# Patient Record
Sex: Male | Born: 1995 | Race: Black or African American | Hispanic: No | Marital: Single | Smoking: Current some day smoker
Health system: Southern US, Community
[De-identification: ages and names within clinical notes are randomized; demographics above are authoritative.]

## PROBLEM LIST (undated history)

## (undated) DIAGNOSIS — N135 Crossing vessel and stricture of ureter without hydronephrosis: Secondary | ICD-10-CM

## (undated) DIAGNOSIS — J302 Other seasonal allergic rhinitis: Secondary | ICD-10-CM

## (undated) DIAGNOSIS — R112 Nausea with vomiting, unspecified: Secondary | ICD-10-CM

## (undated) DIAGNOSIS — J45909 Unspecified asthma, uncomplicated: Secondary | ICD-10-CM

## (undated) HISTORY — PX: FINGER SURGERY: SHX640

---
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
## 2015-06-21 ENCOUNTER — Encounter (HOSPITAL_COMMUNITY): Payer: Self-pay | Admitting: Emergency Medicine

## 2015-06-21 ENCOUNTER — Emergency Department (HOSPITAL_COMMUNITY): Payer: Medicaid Other

## 2015-06-21 ENCOUNTER — Emergency Department (HOSPITAL_COMMUNITY)
Admission: EM | Admit: 2015-06-21 | Discharge: 2015-06-21 | Disposition: A | Discharge status: Home / Self Care | Payer: Medicaid Other | Attending: Emergency Medicine | Admitting: Emergency Medicine

## 2015-06-21 DIAGNOSIS — I880 Nonspecific mesenteric lymphadenitis: Secondary | ICD-10-CM | POA: Diagnosis not present

## 2015-06-21 DIAGNOSIS — J45909 Unspecified asthma, uncomplicated: Secondary | ICD-10-CM | POA: Diagnosis not present

## 2015-06-21 DIAGNOSIS — Q6239 Other obstructive defects of renal pelvis and ureter: Secondary | ICD-10-CM | POA: Insufficient documentation

## 2015-06-21 DIAGNOSIS — Z720 Tobacco use: Secondary | ICD-10-CM | POA: Insufficient documentation

## 2015-06-21 DIAGNOSIS — R109 Unspecified abdominal pain: Secondary | ICD-10-CM | POA: Diagnosis present

## 2015-06-21 DIAGNOSIS — Q6211 Congenital occlusion of ureteropelvic junction: Secondary | ICD-10-CM

## 2015-06-21 LAB — URINALYSIS, ROUTINE W REFLEX MICROSCOPIC
Bilirubin Urine: NEGATIVE
Glucose, UA: NEGATIVE mg/dL
Hgb urine dipstick: NEGATIVE
Ketones, ur: NEGATIVE mg/dL
LEUKOCYTES UA: NEGATIVE
Nitrite: NEGATIVE
PROTEIN: NEGATIVE mg/dL
Specific Gravity, Urine: 1.016 (ref 1.005–1.030)
UROBILINOGEN UA: 0.2 mg/dL (ref 0.0–1.0)
pH: 5.5 (ref 5.0–8.0)

## 2015-06-21 LAB — CBC WITH DIFFERENTIAL/PLATELET
BASOS ABS: 0 10*3/uL (ref 0.0–0.1)
Basophils Relative: 0 %
Eosinophils Absolute: 0.4 10*3/uL (ref 0.0–0.7)
Eosinophils Relative: 4 %
HCT: 49.3 % (ref 39.0–52.0)
HEMOGLOBIN: 16.1 g/dL (ref 13.0–17.0)
LYMPHS PCT: 19 %
Lymphs Abs: 2.1 10*3/uL (ref 0.7–4.0)
MCH: 23.6 pg — ABNORMAL LOW (ref 26.0–34.0)
MCHC: 32.7 g/dL (ref 30.0–36.0)
MCV: 72.4 fL — ABNORMAL LOW (ref 78.0–100.0)
MONOS PCT: 7 %
Monocytes Absolute: 0.8 10*3/uL (ref 0.1–1.0)
NEUTROS ABS: 7.5 10*3/uL (ref 1.7–7.7)
NEUTROS PCT: 70 %
Platelets: 161 10*3/uL (ref 150–400)
RBC: 6.81 MIL/uL — AB (ref 4.22–5.81)
RDW: 13.7 % (ref 11.5–15.5)
WBC: 10.8 10*3/uL — AB (ref 4.0–10.5)

## 2015-06-21 LAB — COMPREHENSIVE METABOLIC PANEL
ALBUMIN: 4.7 g/dL (ref 3.5–5.0)
ALK PHOS: 88 U/L (ref 38–126)
ALT: 13 U/L — ABNORMAL LOW (ref 17–63)
ANION GAP: 9 (ref 5–15)
AST: 16 U/L (ref 15–41)
BUN: 20 mg/dL (ref 6–20)
CALCIUM: 9.7 mg/dL (ref 8.9–10.3)
CO2: 23 mmol/L (ref 22–32)
Chloride: 106 mmol/L (ref 101–111)
Creatinine, Ser: 1.42 mg/dL — ABNORMAL HIGH (ref 0.61–1.24)
GFR calc Af Amer: >60 mL/min (ref >60)
GFR calc non Af Amer: >60 mL/min (ref >60)
GLUCOSE: 96 mg/dL (ref 65–99)
Potassium: 3.9 mmol/L (ref 3.5–5.1)
SODIUM: 138 mmol/L (ref 135–145)
Total Bilirubin: 1.2 mg/dL (ref 0.3–1.2)
Total Protein: 8.4 g/dL — ABNORMAL HIGH (ref 6.5–8.1)

## 2015-06-21 LAB — LIPASE, BLOOD: Lipase: 26 U/L (ref 22–51)

## 2015-06-21 MED ORDER — IOHEXOL 300 MG/ML  SOLN
100.0000 mL | Freq: Once | INTRAMUSCULAR | Status: AC | PRN
  Administered 2015-06-21: 100 mL via INTRAVENOUS

## 2015-06-21 MED ORDER — OXYCODONE-ACETAMINOPHEN 5-325 MG PO TABS: 1.0000 | ORAL_TABLET | Freq: Four times a day (QID) | ORAL | Status: DC | PRN

## 2015-06-21 MED ORDER — ONDANSETRON HCL 4 MG PO TABS: 4.0000 mg | ORAL_TABLET | Freq: Four times a day (QID) | ORAL | Status: DC

## 2015-06-21 MED ORDER — IOHEXOL 300 MG/ML  SOLN
50.0000 mL | Freq: Once | INTRAMUSCULAR | Status: AC | PRN
  Administered 2015-06-21: 50 mL via ORAL

## 2015-06-21 MED ORDER — SODIUM CHLORIDE 0.9 % IV BOLUS (SEPSIS): 1000.0000 mL | Freq: Once | INTRAVENOUS | Status: DC

## 2015-06-21 MED ORDER — ONDANSETRON HCL 4 MG/2ML IJ SOLN
4.0000 mg | Freq: Once | INTRAMUSCULAR | Status: AC
  Administered 2015-06-21: 4 mg via INTRAVENOUS
  Filled 2015-06-21: qty 2

## 2015-06-21 MED ORDER — ONDANSETRON 4 MG PO TBDP
4.0000 mg | ORAL_TABLET | Freq: Once | ORAL | Status: AC
  Administered 2015-06-21: 4 mg via ORAL
  Filled 2015-06-21: qty 1

## 2015-06-21 MED ORDER — OXYCODONE-ACETAMINOPHEN 5-325 MG PO TABS
1.0000 | ORAL_TABLET | Freq: Once | ORAL | Status: AC
Start: 2015-06-21 — End: 2015-06-21
  Administered 2015-06-21: 1 via ORAL
  Filled 2015-06-21: qty 1

## 2015-06-21 MED ORDER — MORPHINE SULFATE (PF) 4 MG/ML IV SOLN
4.0000 mg | Freq: Once | INTRAVENOUS | Status: AC
  Administered 2015-06-21: 4 mg via INTRAVENOUS
  Filled 2015-06-21: qty 1

## 2015-06-21 NOTE — ED Provider Notes (Signed)
CSN: [id number]     Arrival date & time 06/21/15  0437 History   First MD Initiated Contact with Patient 06/21/15 0615     Chief Complaint  Patient presents with  . Flank Pain     (Consider location/radiation/quality/duration/timing/severity/associated sxs/prior Treatment) HPI   Austin Church 19 y.o.male  PCP: Nicolasa Ducking, MD  Blood pressure 139/81, pulse 80, temperature 98.2 F (36.8 C), temperature source Oral, resp. rate 18, height  (1.753 m), SpO2 100 %.  SIGNIFICANT PMH: seasonal allergies and asthma CHIEF COMPLAINT: Right Flank  When: Started approx 3 months Mechanism: no specific mechanism to cause pain Chronicity: > 3 months, never had this problem before then Location: Right flank Radiation: none Quality and severity: sometimes; 0/10 and tonight 7/10- cramping and knotting  Treatments tried: his doctor had given him a muscle relaxer and then a medication for bed time. Does not know names and neither helped. His PCP ordered a CT scan of the abdomen but he missed the appointment. He tried Vicodin given from his girlfriend and this helped some but then pain came back. -- Trazodone, Naproxen and Flexeril per pharmacy Alleviating factors: none Worsening factors: touching the area and laying down Associated Symptoms: vomiting  Negative ROS: Confusion, diaphoresis, fever, headache, weakness (general or focal), change of vision,  neck pain, dysphagia, aphagia, chest pain, shortness of breath,  back pain, diarrhea, lower extremity swelling, rash.   Past Medical History  Diagnosis Date  . Seasonal allergies   . Asthma    Past Surgical History  Procedure Laterality Date  . Finger surgery     Family History  Problem Relation Age of Onset  . Cancer Other    Social History  Substance Use Topics  . Smoking status: Current Every Day Smoker  . Smokeless tobacco: None  . Alcohol Use: Yes    Review of Systems  10 Systems reviewed and are negative for acute  change except as noted in the HPI.    Allergies  Pineapple  Home Medications   Prior to Admission medications   Medication Sig Start Date End Date Taking? Authorizing Provider  cyclobenzaprine (FLEXERIL) 10 MG tablet Take 10 mg by mouth at bedtime as needed. To relax back 04/03/15  Yes Historical Provider, MD  ibuprofen (ADVIL,MOTRIN) 200 MG tablet Take 600 mg by mouth every 6 (six) hours as needed (for pain.).   Yes Historical Provider, MD  naproxen (NAPROSYN) 500 MG tablet Take 500 mg by mouth 2 (two) times daily as needed (for pain.).   Yes Historical Provider, MD  ondansetron (ZOFRAN) 4 MG tablet Take 1 tablet (4 mg total) by mouth every 6 (six) hours. 06/21/15   Marlon Pel, PA-C  oxyCODONE-acetaminophen (PERCOCET/ROXICET) 5-325 MG per tablet Take 1 tablet by mouth every 6 (six) hours as needed for severe pain. 06/21/15   Tiffany Neva Seat, PA-C  permethrin (ELIMITE) 5 % cream Apply to entire body below the neck and leave on for 8 hours then wash off--repeat in 1 week Patient not taking: Reported on 06/21/2015 08/21/13   Marcellina Millin, MD   BP 126/76 mmHg  Pulse 85  Temp(Src) 98.2 F (36.8 C) (Oral)  Resp 18  Ht  (1.753 m)  SpO2 100% Physical Exam  Constitutional: He appears well-developed and well-nourished. No distress.  HENT:  Head: Normocephalic and atraumatic.  Eyes: Pupils are equal, round, and reactive to light.  Neck: Normal range of motion. Neck supple.  Cardiovascular: Normal rate and regular rhythm.   Pulmonary/Chest: Effort normal  and breath sounds normal. No accessory muscle usage. No respiratory distress. He has no wheezes. He has no rhonchi.  Abdominal: Soft. Bowel sounds are normal. He exhibits no distension and no fluid wave. There is tenderness (mild) in the right upper quadrant and right lower quadrant. There is no rebound, no guarding and no CVA tenderness.  Neurological: He is alert.  Skin: Skin is warm and dry.  Psychiatric: He has a normal mood and  affect. His speech is normal.  Nursing note and vitals reviewed.   ED Course  Procedures (including critical care time) Labs Review Labs Reviewed  CBC WITH DIFFERENTIAL/PLATELET - Abnormal; Notable for the following:    WBC 10.8 (*)    RBC 6.81 (*)    MCV 72.4 (*)    MCH 23.6 (*)    All other components within normal limits  COMPREHENSIVE METABOLIC PANEL - Abnormal; Notable for the following:    Creatinine, Ser 1.42 (*)    Total Protein 8.4 (*)    ALT 13 (*)    All other components within normal limits  URINALYSIS, ROUTINE W REFLEX MICROSCOPIC (NOT AT [facility])  LIPASE, BLOOD    Imaging Review Ct Abdomen Pelvis W Contrast  06/21/2015   CLINICAL DATA:  Right flank pain.  Right abdominal pain.  EXAM: CT ABDOMEN AND PELVIS WITH CONTRAST  TECHNIQUE: Multidetector CT imaging of the abdomen and pelvis was performed using the standard protocol following bolus administration of intravenous contrast.  CONTRAST:  50mL OMNIPAQUE IOHEXOL 300 MG/ML SOLN, OMNIPAQUE IOHEXOL 300 MG/ML SOLN  COMPARISON:  None.  FINDINGS: Lower chest:  Clear lung bases.  Normal heart size.  Hepatobiliary: Normal liver. No intrahepatic or extrahepatic biliary duct dilatation. Normal gallbladder.  Pancreas: Normal.  Spleen: Normal.  Adrenals/Urinary Tract: Normal adrenal glands. Normal left kidney. Severe right hydronephrosis with normal caliber ureter as can be seen with a UPJ obstruction. Slight hypoenhancement of the right kidney relative to the left. Excretion of contrast into the right renal collecting system is present. Normal excretion of contrast into the left renal collecting system. Normal bladder.  Stomach/Bowel: No bowel dilatation or bowel wall thickening. Normal caliber appendix without periappendiceal inflammatory changes. No abdominal or pelvic free fluid. No pneumatosis, pneumoperitoneum or portal venous gas.  Vascular/Lymphatic: Normal caliber abdominal aorta. Multiple nonpathologically enlarged right lower  quadrant lymph nodes.  Other: No fluid collection or hematoma.  Musculoskeletal: No acute osseous abnormality. No lytic or sclerotic osseous lesion.  IMPRESSION: 1. Normal appendix. 2. No urolithiasis. 3. Severe right UPJ obstruction. Excretion of a small amount of contrast into the right renal collecting system consistent with a functional kidney. 4. Multiple, small nonpathologically enlarged right lower quadrant lymph nodes as can be seen with mesenteric adenitis.   Electronically Signed   By: Elige Ko   On: 06/21/2015 09:10   I have personally reviewed and evaluated these images and lab results as part of my medical decision-making.   EKG Interpretation None      MDM   Final diagnoses:  UPJ obstruction, congenital    Pt is well appearing, he has been seeing his primary care doctor for pain in his abdomen going on for 3 months. Will obtain CBC, CMP and Lipase as well as CT scan abd/pelv for further evaluation.  CT scan abd/pelv results  3. Severe right UPJ obstruction. Excretion of a small amount of contrast into the right renal collecting system consistent with a functional kidney. 4. Multiple, small nonpathologically enlarged right lower quadrant lymph  nodes as can be seen with mesenteric adenitis.  Consultation from Urology: UPJ is a congenital obstruction that typically presents at age 38 often seen when they start to binge drink. - creatinine is 1.42 today. Dr. Neilan Rizzo [place] says that he will see him in clinic and that he will require surgery. 10-15 Percocet and use sparingly per Dr. [place]. Will also give Zofran, call clinic on Monday and they will get him set up.  Medications  oxyCODONE-acetaminophen (PERCOCET/ROXICET) 5-325 MG per tablet 1 tablet (not administered)  ondansetron (ZOFRAN-ODT) disintegrating tablet 4 mg (not administered)  ondansetron (ZOFRAN) injection 4 mg (4 mg Intravenous Given 06/21/15 0714)  morphine 4 MG/ML injection 4 mg (4 mg Intravenous Given  06/21/15 0714)  iohexol (OMNIPAQUE) 300 MG/ML solution 50 mL (50 mLs Oral Contrast Given 06/21/15 0701)  iohexol (OMNIPAQUE) 300 MG/ML solution 100 mL (100 mLs Intravenous Contrast Given 06/21/15 0841)    19 y.o.Austin Church's evaluation in the Emergency Department is complete. It has been determined that no acute conditions requiring further emergency intervention are present at this time. The patient/guardian have been advised of the diagnosis and plan. We have discussed signs and symptoms that warrant return to the ED, such as changes or worsening in symptoms.  Vital signs are stable at discharge. Filed Vitals:   06/21/15 0856  BP: 126/76  Pulse: 85  Temp: 98.2 F (36.8 C)  Resp: 18    Patient/guardian has voiced understanding and agreed to follow-up with the PCP or specialist.   Marlon Pel, PA-C 06/21/15 0946  Marlon Pel, PA-C 06/21/15 1610  Loren Racer, MD 06/22/15 [phone]

## 2015-06-21 NOTE — ED Notes (Signed)
Pt states he is having pain in his right flank area  Pt states pain started about 3 months ago but is worse tonight  Pt states the pain is constant  Denies any urinary sxs  Pt states he has had episodes of vomiting

## 2015-06-21 NOTE — ED Notes (Signed)
Pt also states when he has pain he feels like his abdomen gets bloated  Pt states he has not drank alcohol because when he does it makes his pain worse

## 2015-06-30 ENCOUNTER — Other Ambulatory Visit: Payer: Self-pay | Admitting: Urology

## 2015-06-30 DIAGNOSIS — Q6239 Other obstructive defects of renal pelvis and ureter: Secondary | ICD-10-CM

## 2015-06-30 DIAGNOSIS — Q6211 Congenital occlusion of ureteropelvic junction: Principal | ICD-10-CM

## 2015-07-02 ENCOUNTER — Other Ambulatory Visit: Payer: Self-pay | Admitting: Urology

## 2015-07-02 ENCOUNTER — Ambulatory Visit (HOSPITAL_COMMUNITY)
Admission: RE | Admit: 2015-07-02 | Discharge: 2015-07-02 | Disposition: A | Discharge status: Home / Self Care | Payer: Medicaid Other | Source: Ambulatory Visit | Attending: Urology | Admitting: Urology

## 2015-07-02 DIAGNOSIS — Q6211 Congenital occlusion of ureteropelvic junction: Secondary | ICD-10-CM | POA: Insufficient documentation

## 2015-07-02 DIAGNOSIS — N1330 Unspecified hydronephrosis: Secondary | ICD-10-CM | POA: Diagnosis not present

## 2015-07-02 DIAGNOSIS — Q6239 Other obstructive defects of renal pelvis and ureter: Secondary | ICD-10-CM

## 2015-07-02 MED ORDER — FUROSEMIDE 10 MG/ML IJ SOLN
INTRAMUSCULAR | Status: AC
  Filled 2015-07-02: qty 4

## 2015-07-02 MED ORDER — TECHNETIUM TC 99M MERTIATIDE
15.0000 | Freq: Once | INTRAVENOUS | Status: AC | PRN
Start: 2015-07-02 — End: 2015-07-02
  Administered 2015-07-02: 14 via INTRAVENOUS

## 2015-07-02 MED ORDER — FUROSEMIDE 10 MG/ML IJ SOLN
43.6300 mg | Freq: Once | INTRAMUSCULAR | Status: AC
  Administered 2015-07-02: 40 mg via INTRAVENOUS

## 2015-07-28 ENCOUNTER — Inpatient Hospital Stay (HOSPITAL_COMMUNITY)
Admission: RE | Admit: 2015-07-28 | Discharge: 2015-07-28 | Disposition: A | Discharge status: Home / Self Care | Payer: Medicaid Other | Source: Ambulatory Visit

## 2015-07-28 NOTE — Patient Instructions (Signed)
20 Olson G Pesqueira  07/28/2015   Your procedure is scheduled on:   -2016  [facility] Arrive at        AM / PM.  (Limit 1 person with you).  Call this number if you have problems the morning of surgery: [phone]  Or Presurgical Testing [phone].   For Living Will and/or Health Care Power Attorney Forms: please provide copy for your medical record,may bring AM of surgery(Forms should be already notarized -we do not provide this service).(Yes/ No information preferred today).  Remember: Follow any bowel prep instructions per MD office. For Cpap use: Bring mask and tubing only.   Do not eat food/ or drink: After Midnight.  Exception: may have clear liquids:up to 6 Hours before arrival. Nothing after:  Clear liquids include soda, tea, black coffee, apple or grape juice, broth.  Take these medicines the morning of surgery with A SIP OF WATER-   (DO NOT TAKE ANY DIABETIC MEDS AM OF SURGERY) :    Do not wear jewelry, make-up or nail polish.  Do not wear deodorant, lotions, powders, or perfumes.   Do not shave legs and under arms- 48 hours(2 days) prior to first CHG shower.(Shaving face and neck okay.)  Do not bring valuables to the hospital.(Hospital is not responsible for lost valuables).  Contacts, dentures or removable bridgework, body piercing, hair pins may not be worn into surgery.  Leave suitcase in the car. After surgery it may be brought to your room.  For patients admitted to the hospital, checkout time is 11:00 AM the day of discharge.(Restricted visitors-Any Persons displaying flu-like symptoms or illness).    Patients discharged the day of surgery will not be allowed to drive home. Must have responsible person with you x 24 hours once discharged.  Name and phone number of your driver:      Please read over the following fact sheets that you were given:  CHG(Chlorhexidine Gluconate 4% Surgical Soap)  use, MRSA Information, Blood Transfusion fact sheet, Incentive Spirometry Instruction.  Remember : Type/Screen "Blue armbands" - may not be removed once applied(would result in being retested AM of surgery, if removed).         [facility] - Preparing for Surgery Before surgery, you can play an important role.  Because skin is not sterile, your skin needs to be as free of germs as possible.  You can reduce the number of germs on your skin by washing with CHG (chlorahexidine gluconate) soap before surgery.  CHG is an antiseptic cleaner which kills germs and bonds with the skin to continue killing germs even after washing. Please DO NOT use if you have an allergy to CHG or antibacterial soaps.  If your skin becomes reddened/irritated stop using the CHG and inform your nurse when you arrive at Short Stay. Do not shave (including legs and underarms) for at least 48 hours prior to the first CHG shower.  You may shave your face/neck. Please follow these instructions carefully:  1.  Shower with CHG Soap the night before surgery and the  morning of Surgery.  2.  If you choose to wash your hair, wash your hair first as usual with your  normal  shampoo.  3.  After you shampoo, rinse your hair and body thoroughly to remove the  shampoo.  4.  Use CHG as you would any other liquid soap.  You can apply chg directly  to the skin and wash                       Gently with a scrungie or clean washcloth.  5.  Apply the CHG Soap to your body ONLY FROM THE NECK DOWN.   Do not use on face/ open                           Wound or open sores. Avoid contact with eyes, ears mouth and genitals (private parts).                       Wash face,  Genitals (private parts) with your normal soap.             6.  Wash thoroughly, paying special attention to the area where your surgery  will be performed.  7.  Thoroughly rinse your body with warm water from the neck down.  8.  DO NOT shower/wash with  your normal soap after using and rinsing off  the CHG Soap.                9.  Pat yourself dry with a clean towel.            10.  Wear clean pajamas.            11.  Place clean sheets on your bed the night of your first shower and do not  sleep with pets. Day of Surgery : Do not apply any lotions/deodorants the morning of surgery.  Please wear clean clothes to the hospital/surgery center.  FAILURE TO FOLLOW THESE INSTRUCTIONS MAY RESULT IN THE CANCELLATION OF YOUR SURGERY PATIENT SIGNATURE_________________________________  NURSE SIGNATURE__________________________________  ________________________________________________________________________

## 2015-07-29 ENCOUNTER — Encounter (HOSPITAL_COMMUNITY)
Admission: RE | Admit: 2015-07-29 | Discharge: 2015-07-29 | Disposition: A | Discharge status: Home / Self Care | Payer: Medicaid Other | Source: Ambulatory Visit | Attending: Urology | Admitting: Urology

## 2015-07-29 ENCOUNTER — Encounter (HOSPITAL_COMMUNITY): Payer: Self-pay

## 2015-07-29 HISTORY — DX: Nausea with vomiting, unspecified: R11.2

## 2015-07-29 LAB — BASIC METABOLIC PANEL
Anion gap: 7 (ref 5–15)
BUN: 15 mg/dL (ref 6–20)
CALCIUM: 9.7 mg/dL (ref 8.9–10.3)
CO2: 28 mmol/L (ref 22–32)
Chloride: 105 mmol/L (ref 101–111)
Creatinine, Ser: 1.38 mg/dL — ABNORMAL HIGH (ref 0.61–1.24)
GFR calc Af Amer: >60 mL/min (ref >60)
GFR calc non Af Amer: >60 mL/min (ref >60)
GLUCOSE: 97 mg/dL (ref 65–99)
Potassium: 4.5 mmol/L (ref 3.5–5.1)
Sodium: 140 mmol/L (ref 135–145)

## 2015-07-29 LAB — CBC
HEMATOCRIT: 47.4 % (ref 39.0–52.0)
Hemoglobin: 15.2 g/dL (ref 13.0–17.0)
MCH: 23.3 pg — ABNORMAL LOW (ref 26.0–34.0)
MCHC: 32.1 g/dL (ref 30.0–36.0)
MCV: 72.7 fL — AB (ref 78.0–100.0)
Platelets: 160 10*3/uL (ref 150–400)
RBC: 6.52 MIL/uL — ABNORMAL HIGH (ref 4.22–5.81)
RDW: 14 % (ref 11.5–15.5)
WBC: 5 10*3/uL (ref 4.0–10.5)

## 2015-07-29 LAB — ABO/RH: ABO/RH(D): O POS

## 2015-07-29 MED ORDER — DEXTROSE 5 % IV SOLN
2.0000 g | INTRAVENOUS | Status: AC
  Administered 2015-07-30: 2 g via INTRAVENOUS
  Filled 2015-07-29: qty 2

## 2015-07-29 NOTE — Patient Instructions (Addendum)
20 Ruble G Prospero  07/29/2015   Your procedure is scheduled on:   07-30-2015 Wednesday  [facility]. Arrive at     0630   AM.  (Limit 1 person with you).  Call this number if you have problems the morning of surgery: [phone]  Or Presurgical Testing [phone].   For Living Will and/or Health Care Power Attorney Forms: please provide copy for your medical record,may bring AM of surgery(Forms should be already notarized -we do not provide this service).(07-29-15 Yes/ No information preferred today).  Remember: Follow any bowel prep instructions per MD office.    Do not eat food/ or drink: After Midnight.      Take these medicines the morning of surgery with A SIP OF WATER-   (DO NOT TAKE ANY DIABETIC MEDS AM OF SURGERY) : NONE.   Do not wear jewelry, make-up or nail polish.  Do not wear deodorant, lotions, powders, or perfumes.   Do not shave legs and under arms- 48 hours(2 days) prior to first CHG shower.(Shaving face and neck okay.)  Do not bring valuables to the hospital.(Hospital is not responsible for lost valuables).  Contacts, dentures or removable bridgework, body piercing, hair pins may not be worn into surgery.  Leave suitcase in the car. After surgery it may be brought to your room.  For patients admitted to the hospital, checkout time is 11:00 AM the day of discharge.(Restricted visitors-Any Persons displaying flu-like symptoms or illness).    Patients discharged the day of surgery will not be allowed to drive home. Must have responsible person with you x 24 hours once discharged.  Name and phone number of your driver: Austin SmilesHailee Church [phone] cell     Please read over the following fact sheets that you were given:  CHG(Chlorhexidine Gluconate 4% Surgical Soap) use.  Remember : Type/Screen "Blue armbands" - may not be removed once applied(would result in being retested AM of  surgery, if removed).         Austin Church - Preparing for Surgery Before surgery, you can play an important role.  Because skin is not sterile, your skin needs to be as free of germs as possible.  You can reduce the number of germs on your skin by washing with CHG (chlorahexidine gluconate) soap before surgery.  CHG is an antiseptic cleaner which kills germs and bonds with the skin to continue killing germs even after washing. Please DO NOT use if you have an allergy to CHG or antibacterial soaps.  If your skin becomes reddened/irritated stop using the CHG and inform your nurse when you arrive at Short Stay. Do not shave (including legs and underarms) for at least 48 hours prior to the first CHG shower.  You may shave your face/neck. Please follow these instructions carefully:  1.  Shower with CHG Soap the night before surgery and the  morning of Surgery.  2.  If you choose to wash your hair, wash your hair first as usual with your  normal  shampoo.  3.  After you shampoo, rinse your hair and body thoroughly to remove the  shampoo.                           4.  Use CHG as you would any other liquid soap.  You can apply chg directly  to the skin and wash  Gently with a scrungie or clean washcloth.  5.  Apply the CHG Soap to your body ONLY FROM THE NECK DOWN.   Do not use on face/ open                           Wound or open sores. Avoid contact with eyes, ears mouth and genitals (private parts).                       Wash face,  Genitals (private parts) with your normal soap.             6.  Wash thoroughly, paying special attention to the area where your surgery  will be performed.  7.  Thoroughly rinse your body with warm water from the neck down.  8.  DO NOT shower/wash with your normal soap after using and rinsing off  the CHG Soap.                9.  Pat yourself dry with a clean towel.            10.  Wear clean pajamas.            11.  Place clean sheets on your bed  the night of your first shower and do not  sleep with pets. Day of Surgery : Do not apply any lotions/deodorants the morning of surgery.  Please wear clean clothes to the hospital/surgery center.  FAILURE TO FOLLOW THESE INSTRUCTIONS MAY RESULT IN THE CANCELLATION OF YOUR SURGERY PATIENT SIGNATURE_________________________________  NURSE SIGNATURE__________________________________  ________________________________________________________________________

## 2015-07-30 ENCOUNTER — Inpatient Hospital Stay (HOSPITAL_COMMUNITY): Payer: Medicaid Other | Admitting: Certified Registered Nurse Anesthetist

## 2015-07-30 ENCOUNTER — Encounter (HOSPITAL_COMMUNITY): Payer: Self-pay | Admitting: Certified Registered Nurse Anesthetist

## 2015-07-30 ENCOUNTER — Inpatient Hospital Stay (HOSPITAL_COMMUNITY): Payer: Medicaid Other

## 2015-07-30 ENCOUNTER — Inpatient Hospital Stay (HOSPITAL_COMMUNITY)
Admission: RE | Admit: 2015-07-30 | Discharge: 2015-08-01 | DRG: 661 | Disposition: A | Discharge status: Home / Self Care | Payer: Medicaid Other | Source: Ambulatory Visit | Attending: Urology | Admitting: Urology

## 2015-07-30 ENCOUNTER — Encounter (HOSPITAL_COMMUNITY)
Admission: RE | Disposition: A | Discharge status: Home / Self Care | Payer: Self-pay | Source: Ambulatory Visit | Attending: Urology

## 2015-07-30 DIAGNOSIS — J45909 Unspecified asthma, uncomplicated: Secondary | ICD-10-CM | POA: Diagnosis present

## 2015-07-30 DIAGNOSIS — Z91018 Allergy to other foods: Secondary | ICD-10-CM | POA: Diagnosis not present

## 2015-07-30 DIAGNOSIS — Q6211 Congenital occlusion of ureteropelvic junction: Secondary | ICD-10-CM

## 2015-07-30 DIAGNOSIS — N1330 Unspecified hydronephrosis: Secondary | ICD-10-CM

## 2015-07-30 DIAGNOSIS — F17210 Nicotine dependence, cigarettes, uncomplicated: Secondary | ICD-10-CM | POA: Diagnosis present

## 2015-07-30 DIAGNOSIS — Q6239 Other obstructive defects of renal pelvis and ureter: Principal | ICD-10-CM

## 2015-07-30 HISTORY — DX: Crossing vessel and stricture of ureter without hydronephrosis: N13.5

## 2015-07-30 HISTORY — PX: CYSTOSCOPY W/ URETERAL STENT PLACEMENT: SHX1429

## 2015-07-30 HISTORY — PX: ROBOT ASSISTED PYELOPLASTY: SHX5143

## 2015-07-30 LAB — TYPE AND SCREEN
ABO/RH(D): O POS
Antibody Screen: NEGATIVE

## 2015-07-30 SURGERY — ROBOTIC ASSISTED PYELOPLASTY WITH STENT PLACEMENT
Anesthesia: General | Laterality: Right

## 2015-07-30 MED ORDER — SODIUM CHLORIDE 0.9 % IJ SOLN: 3.0000 mL | Freq: Two times a day (BID) | INTRAMUSCULAR | Status: DC

## 2015-07-30 MED ORDER — GLYCOPYRROLATE 0.2 MG/ML IJ SOLN
INTRAMUSCULAR | Status: DC | PRN
  Administered 2015-07-30: 0.6 mg via INTRAVENOUS

## 2015-07-30 MED ORDER — FENTANYL CITRATE (PF) 100 MCG/2ML IJ SOLN
25.0000 ug | INTRAMUSCULAR | Status: DC | PRN
  Administered 2015-07-30 (×2): 25 ug via INTRAVENOUS

## 2015-07-30 MED ORDER — MORPHINE SULFATE (PF) 2 MG/ML IV SOLN
2.0000 mg | INTRAVENOUS | Status: DC | PRN
  Administered 2015-07-30: 4 mg via INTRAVENOUS
  Administered 2015-07-31 – 2015-08-01 (×2): 2 mg via INTRAVENOUS
  Filled 2015-07-30 (×3): qty 1

## 2015-07-30 MED ORDER — FENTANYL CITRATE (PF) 100 MCG/2ML IJ SOLN
INTRAMUSCULAR | Status: AC
  Filled 2015-07-30: qty 2

## 2015-07-30 MED ORDER — SODIUM CHLORIDE 0.9 % IJ SOLN
INTRAMUSCULAR | Status: AC
Start: 2015-07-30 — End: 2015-07-30
  Filled 2015-07-30: qty 10

## 2015-07-30 MED ORDER — DOCUSATE SODIUM 100 MG PO CAPS
100.0000 mg | ORAL_CAPSULE | Freq: Two times a day (BID) | ORAL | Status: DC
  Administered 2015-07-30 – 2015-07-31 (×3): 100 mg via ORAL
  Filled 2015-07-30 (×3): qty 1

## 2015-07-30 MED ORDER — HYDROMORPHONE HCL 1 MG/ML IJ SOLN
INTRAMUSCULAR | Status: AC
  Filled 2015-07-30: qty 1

## 2015-07-30 MED ORDER — DIPHENHYDRAMINE HCL 50 MG/ML IJ SOLN: 12.5000 mg | Freq: Four times a day (QID) | INTRAMUSCULAR | Status: DC | PRN

## 2015-07-30 MED ORDER — SODIUM CHLORIDE 0.9 % IJ SOLN: 9.0000 mL | INTRAMUSCULAR | Status: DC | PRN

## 2015-07-30 MED ORDER — ESMOLOL HCL 100 MG/10ML IV SOLN
INTRAVENOUS | Status: AC
  Filled 2015-07-30: qty 10

## 2015-07-30 MED ORDER — HYDROMORPHONE 1 MG/ML IV SOLN
INTRAVENOUS | Status: DC
  Administered 2015-07-30: 3.2 mg via INTRAVENOUS
  Administered 2015-07-30: 17:00:00 via INTRAVENOUS
  Administered 2015-07-31: 2.4 mg via INTRAVENOUS
  Administered 2015-07-31: 3.9 mg via INTRAVENOUS
  Administered 2015-07-31: 0.9 mg via INTRAVENOUS
  Administered 2015-07-31: 1.8 mg via INTRAVENOUS
  Filled 2015-07-30: qty 25

## 2015-07-30 MED ORDER — HYDROMORPHONE HCL 1 MG/ML IJ SOLN
0.2500 mg | INTRAMUSCULAR | Status: DC | PRN
  Administered 2015-07-30: 0.5 mg via INTRAVENOUS
  Administered 2015-07-30: 0.25 mg via INTRAVENOUS
  Administered 2015-07-30 (×3): 0.5 mg via INTRAVENOUS

## 2015-07-30 MED ORDER — ACETAMINOPHEN 325 MG PO TABS: 650.0000 mg | ORAL_TABLET | ORAL | Status: DC | PRN

## 2015-07-30 MED ORDER — SENNA 8.6 MG PO TABS
1.0000 | ORAL_TABLET | Freq: Two times a day (BID) | ORAL | Status: DC
  Administered 2015-07-30 – 2015-07-31 (×3): 8.6 mg via ORAL
  Filled 2015-07-30 (×3): qty 1

## 2015-07-30 MED ORDER — ONDANSETRON HCL 4 MG/2ML IJ SOLN
INTRAMUSCULAR | Status: AC
  Filled 2015-07-30: qty 2

## 2015-07-30 MED ORDER — LIDOCAINE HCL (CARDIAC) 20 MG/ML IV SOLN
INTRAVENOUS | Status: AC
Start: 2015-07-30 — End: 2015-07-30
  Filled 2015-07-30: qty 5

## 2015-07-30 MED ORDER — SODIUM CHLORIDE 0.9 % IJ SOLN
INTRAMUSCULAR | Status: AC
  Filled 2015-07-30: qty 10

## 2015-07-30 MED ORDER — ACETAMINOPHEN 500 MG PO TABS
1000.0000 mg | ORAL_TABLET | Freq: Four times a day (QID) | ORAL | Status: DC
  Administered 2015-07-30 – 2015-07-31 (×4): 1000 mg via ORAL
  Filled 2015-07-30 (×4): qty 2

## 2015-07-30 MED ORDER — METOCLOPRAMIDE HCL 5 MG/ML IJ SOLN: 10.0000 mg | Freq: Once | INTRAMUSCULAR | Status: DC | PRN

## 2015-07-30 MED ORDER — CEFAZOLIN SODIUM 1-5 GM-% IV SOLN
1.0000 g | Freq: Three times a day (TID) | INTRAVENOUS | Status: AC
  Administered 2015-07-30 – 2015-07-31 (×2): 1 g via INTRAVENOUS
  Filled 2015-07-30 (×3): qty 50

## 2015-07-30 MED ORDER — SODIUM CHLORIDE 0.9 % IJ SOLN
INTRAMUSCULAR | Status: AC
  Filled 2015-07-30: qty 20

## 2015-07-30 MED ORDER — PROPOFOL 10 MG/ML IV BOLUS
INTRAVENOUS | Status: DC | PRN
  Administered 2015-07-30: 150 mg via INTRAVENOUS
  Administered 2015-07-30: 50 mg via INTRAVENOUS

## 2015-07-30 MED ORDER — NEOSTIGMINE METHYLSULFATE 10 MG/10ML IV SOLN
INTRAVENOUS | Status: DC | PRN
Start: 2015-07-30 — End: 2015-07-30
  Administered 2015-07-30: 5 mg via INTRAVENOUS

## 2015-07-30 MED ORDER — ONDANSETRON HCL 4 MG/2ML IJ SOLN: 4.0000 mg | Freq: Four times a day (QID) | INTRAMUSCULAR | Status: DC | PRN

## 2015-07-30 MED ORDER — FENTANYL CITRATE (PF) 100 MCG/2ML IJ SOLN
INTRAMUSCULAR | Status: AC
Start: 2015-07-30 — End: 2015-07-31
  Filled 2015-07-30: qty 2

## 2015-07-30 MED ORDER — MORPHINE SULFATE (PF) 4 MG/ML IV SOLN
INTRAVENOUS | Status: AC
  Administered 2015-07-30: 4 mg
  Filled 2015-07-30: qty 1

## 2015-07-30 MED ORDER — OXYCODONE-ACETAMINOPHEN 5-325 MG PO TABS
1.0000 | ORAL_TABLET | ORAL | Status: DC | PRN
  Administered 2015-07-31 – 2015-08-01 (×4): 2 via ORAL
  Filled 2015-07-30 (×5): qty 2

## 2015-07-30 MED ORDER — ONDANSETRON HCL 4 MG/2ML IJ SOLN
INTRAMUSCULAR | Status: DC | PRN
  Administered 2015-07-30: 4 mg via INTRAVENOUS

## 2015-07-30 MED ORDER — LIDOCAINE HCL (CARDIAC) 20 MG/ML IV SOLN
INTRAVENOUS | Status: DC | PRN
  Administered 2015-07-30: 100 mg via INTRAVENOUS

## 2015-07-30 MED ORDER — DEXAMETHASONE SODIUM PHOSPHATE 10 MG/ML IJ SOLN
INTRAMUSCULAR | Status: DC | PRN
  Administered 2015-07-30: 10 mg via INTRAVENOUS

## 2015-07-30 MED ORDER — MIDAZOLAM HCL 5 MG/5ML IJ SOLN
INTRAMUSCULAR | Status: DC | PRN
  Administered 2015-07-30: 2 mg via INTRAVENOUS

## 2015-07-30 MED ORDER — IOHEXOL 300 MG/ML  SOLN
INTRAMUSCULAR | Status: DC | PRN
  Administered 2015-07-30: 50 mL via INTRAVENOUS

## 2015-07-30 MED ORDER — ROCURONIUM BROMIDE 100 MG/10ML IV SOLN
INTRAVENOUS | Status: DC | PRN
  Administered 2015-07-30 (×2): 10 mg via INTRAVENOUS
  Administered 2015-07-30: 50 mg via INTRAVENOUS

## 2015-07-30 MED ORDER — PROPOFOL 10 MG/ML IV BOLUS
INTRAVENOUS | Status: AC
  Filled 2015-07-30: qty 20

## 2015-07-30 MED ORDER — DEXAMETHASONE SODIUM PHOSPHATE 10 MG/ML IJ SOLN
INTRAMUSCULAR | Status: AC
  Filled 2015-07-30: qty 1

## 2015-07-30 MED ORDER — HYDROMORPHONE HCL 1 MG/ML IJ SOLN
INTRAMUSCULAR | Status: DC | PRN
  Administered 2015-07-30: 0.5 mg via INTRAVENOUS
  Administered 2015-07-30: .4 mg via INTRAVENOUS
  Administered 2015-07-30: .2 mg via INTRAVENOUS
  Administered 2015-07-30: 1 mg via INTRAVENOUS
  Administered 2015-07-30: 0.5 mg via INTRAVENOUS
  Administered 2015-07-30 (×2): .2 mg via INTRAVENOUS
  Administered 2015-07-30: 1 mg via INTRAVENOUS

## 2015-07-30 MED ORDER — ONDANSETRON HCL 4 MG/2ML IJ SOLN: 4.0000 mg | INTRAMUSCULAR | Status: DC | PRN

## 2015-07-30 MED ORDER — FENTANYL CITRATE (PF) 100 MCG/2ML IJ SOLN
INTRAMUSCULAR | Status: DC | PRN
  Administered 2015-07-30 (×2): 100 ug via INTRAVENOUS
  Administered 2015-07-30 (×3): 50 ug via INTRAVENOUS
  Administered 2015-07-30: 100 ug via INTRAVENOUS
  Administered 2015-07-30: 50 ug via INTRAVENOUS

## 2015-07-30 MED ORDER — HYDROMORPHONE HCL 2 MG/ML IJ SOLN
INTRAMUSCULAR | Status: AC
  Filled 2015-07-30: qty 1

## 2015-07-30 MED ORDER — ROCURONIUM BROMIDE 100 MG/10ML IV SOLN
INTRAVENOUS | Status: AC
  Filled 2015-07-30: qty 1

## 2015-07-30 MED ORDER — DIPHENHYDRAMINE HCL 12.5 MG/5ML PO ELIX: 12.5000 mg | ORAL_SOLUTION | Freq: Four times a day (QID) | ORAL | Status: DC | PRN

## 2015-07-30 MED ORDER — PHENYLEPHRINE HCL 10 MG/ML IJ SOLN
INTRAMUSCULAR | Status: DC | PRN
  Administered 2015-07-30 (×3): 80 ug via INTRAVENOUS

## 2015-07-30 MED ORDER — LACTATED RINGERS IR SOLN
Status: DC | PRN
  Administered 2015-07-30: 1000 mL

## 2015-07-30 MED ORDER — BUPIVACAINE LIPOSOME 1.3 % IJ SUSP
20.0000 mL | Freq: Once | INTRAMUSCULAR | Status: AC
  Administered 2015-07-30: 20 mL
  Filled 2015-07-30: qty 20

## 2015-07-30 MED ORDER — SUCCINYLCHOLINE CHLORIDE 20 MG/ML IJ SOLN
INTRAMUSCULAR | Status: DC | PRN
  Administered 2015-07-30: 100 mg via INTRAVENOUS

## 2015-07-30 MED ORDER — SODIUM CHLORIDE 0.9 % IJ SOLN: 3.0000 mL | INTRAMUSCULAR | Status: DC | PRN

## 2015-07-30 MED ORDER — FENTANYL CITRATE (PF) 100 MCG/2ML IJ SOLN
25.0000 ug | INTRAMUSCULAR | Status: AC | PRN
  Administered 2015-07-30: 50 ug via INTRAVENOUS
  Administered 2015-07-30 (×3): 25 ug via INTRAVENOUS
  Administered 2015-07-30: 50 ug via INTRAVENOUS
  Administered 2015-07-30: 25 ug via INTRAVENOUS

## 2015-07-30 MED ORDER — FENTANYL CITRATE (PF) 250 MCG/5ML IJ SOLN
INTRAMUSCULAR | Status: AC
  Filled 2015-07-30: qty 25

## 2015-07-30 MED ORDER — LACTATED RINGERS IV SOLN
INTRAVENOUS | Status: DC
  Administered 2015-07-30: 1000 mL via INTRAVENOUS
  Administered 2015-07-30 (×2): via INTRAVENOUS

## 2015-07-30 MED ORDER — HYDROMORPHONE HCL 1 MG/ML IJ SOLN
INTRAMUSCULAR | Status: AC
Start: 2015-07-30 — End: 2015-07-31
  Filled 2015-07-30: qty 1

## 2015-07-30 MED ORDER — DEXTROSE 5 % IV SOLN
INTRAVENOUS | Status: AC
  Filled 2015-07-30: qty 2

## 2015-07-30 MED ORDER — BUPIVACAINE-EPINEPHRINE 0.25% -1:200000 IJ SOLN
INTRAMUSCULAR | Status: AC
  Filled 2015-07-30: qty 1

## 2015-07-30 MED ORDER — SODIUM CHLORIDE 0.9 % IV SOLN: INTRAVENOUS | Status: DC | PRN

## 2015-07-30 MED ORDER — ESMOLOL HCL 100 MG/10ML IV SOLN
INTRAVENOUS | Status: DC | PRN
Start: 2015-07-30 — End: 2015-07-30
  Administered 2015-07-30 (×4): 20 mg via INTRAVENOUS

## 2015-07-30 MED ORDER — NALOXONE HCL 0.4 MG/ML IJ SOLN: 0.4000 mg | INTRAMUSCULAR | Status: DC | PRN

## 2015-07-30 MED ORDER — MIDAZOLAM HCL 2 MG/2ML IJ SOLN
INTRAMUSCULAR | Status: AC
  Filled 2015-07-30: qty 4

## 2015-07-30 MED ORDER — EPHEDRINE SULFATE 50 MG/ML IJ SOLN
INTRAMUSCULAR | Status: AC
  Filled 2015-07-30: qty 1

## 2015-07-30 MED ORDER — HYDROMORPHONE HCL 1 MG/ML IJ SOLN
0.2500 mg | INTRAMUSCULAR | Status: DC | PRN
  Administered 2015-07-30 (×3): 0.25 mg via INTRAVENOUS

## 2015-07-30 SURGICAL SUPPLY — 86 items
ADH SKN CLS APL DERMABOND .7 (GAUZE/BANDAGES/DRESSINGS) ×1
BAG URO CATCHER STRL LF (DRAPE) ×3 IMPLANT
BASKET LASER NITINOL 1.9FR (BASKET) IMPLANT
BASKET STNLS GEMINI 4WIRE 3FR (BASKET) IMPLANT
BASKET ZERO TIP NITINOL 2.4FR (BASKET) IMPLANT
BSKT STON RTRVL 120 1.9FR (BASKET)
BSKT STON RTRVL GEM 120X11 3FR (BASKET)
BSKT STON RTRVL ZERO TP 2.4FR (BASKET)
CANISTER OMNI JUG 16 LITER (MISCELLANEOUS) ×3 IMPLANT
CATH INTERMIT  6FR 70CM (CATHETERS) ×3 IMPLANT
CHLORAPREP W/TINT 26ML (MISCELLANEOUS) ×3 IMPLANT
CLIP LIGATING HEM O LOK PURPLE (MISCELLANEOUS) IMPLANT
CLIP LIGATING HEMO O LOK GREEN (MISCELLANEOUS) ×2 IMPLANT
CLOTH BEACON ORANGE TIMEOUT ST (SAFETY) ×3 IMPLANT
CORD HIGH FREQUENCY UNIPOLAR (ELECTROSURGICAL) ×3 IMPLANT
CORDS BIPOLAR (ELECTRODE) ×3 IMPLANT
COVER TIP SHEARS 8 DVNC (MISCELLANEOUS) ×1 IMPLANT
COVER TIP SHEARS 8MM DA VINCI (MISCELLANEOUS) ×2
DECANTER SPIKE VIAL GLASS SM (MISCELLANEOUS) ×3 IMPLANT
DERMABOND ADVANCED (GAUZE/BANDAGES/DRESSINGS) ×2
DERMABOND ADVANCED .7 DNX12 (GAUZE/BANDAGES/DRESSINGS) IMPLANT
DRAIN CHANNEL 15F RND FF 3/16 (WOUND CARE) ×3 IMPLANT
DRAPE INCISE IOBAN 66X45 STRL (DRAPES) ×3 IMPLANT
DRAPE LAPAROSCOPIC ABDOMINAL (DRAPES) ×3 IMPLANT
DRAPE SHEET LG 3/4 BI-LAMINATE (DRAPES) ×3 IMPLANT
DRAPE TABLE BACK 44X90 PK DISP (DRAPES) ×3 IMPLANT
DRAPE UTILITY XL STRL (DRAPES) ×3 IMPLANT
DRAPE WARM FLUID 44X44 (DRAPE) ×3 IMPLANT
ELECT PENCIL ROCKER SW 15FT (MISCELLANEOUS) ×3 IMPLANT
ELECT REM PT RETURN 9FT ADLT (ELECTROSURGICAL) ×3
ELECTRODE REM PT RTRN 9FT ADLT (ELECTROSURGICAL) ×1 IMPLANT
EVACUATOR SILICONE 100CC (DRAIN) ×3 IMPLANT
FIBER LASER FLEXIVA 1000 (UROLOGICAL SUPPLIES) IMPLANT
FIBER LASER FLEXIVA 200 (UROLOGICAL SUPPLIES) IMPLANT
FIBER LASER FLEXIVA 365 (UROLOGICAL SUPPLIES) IMPLANT
FIBER LASER FLEXIVA 550 (UROLOGICAL SUPPLIES) IMPLANT
FIBER LASER TRAC TIP (UROLOGICAL SUPPLIES) IMPLANT
GLOVE BIOGEL M STRL SZ7.5 (GLOVE) ×9 IMPLANT
GOWN STRL REUS W/TWL LRG LVL3 (GOWN DISPOSABLE) ×6 IMPLANT
GOWN STRL REUS W/TWL XL LVL3 (GOWN DISPOSABLE) ×6 IMPLANT
GUIDEWIRE ANG ZIPWIRE 038X150 (WIRE) ×3 IMPLANT
GUIDEWIRE STR DUAL SENSOR (WIRE) ×3 IMPLANT
IV NS IRRIG 3000ML ARTHROMATIC (IV SOLUTION) ×3 IMPLANT
KIT ACCESSORY DA VINCI DISP (KITS) ×2
KIT ACCESSORY DVNC DISP (KITS) ×1 IMPLANT
KIT BASIN OR (CUSTOM PROCEDURE TRAY) ×3 IMPLANT
NDL INSUFFLATION 14GA 120MM (NEEDLE) ×1 IMPLANT
NEEDLE INSUFFLATION 14GA 120MM (NEEDLE) ×3 IMPLANT
NS IRRIG 1000ML POUR BTL (IV SOLUTION) ×3 IMPLANT
PACK CYSTO (CUSTOM PROCEDURE TRAY) ×3 IMPLANT
PAD POSITIONING PINK XL (MISCELLANEOUS) IMPLANT
POSITIONER SURGICAL ARM (MISCELLANEOUS) ×6 IMPLANT
SET TUBE IRRIG SUCTION NO TIP (IRRIGATION / IRRIGATOR) ×2 IMPLANT
SHEET LAVH (DRAPES) ×2 IMPLANT
SLEEVE XCEL OPT CAN 5 100 (ENDOMECHANICALS) ×2 IMPLANT
SOLUTION ANTI FOG 6CC (MISCELLANEOUS) ×3 IMPLANT
SOLUTION ELECTROLUBE (MISCELLANEOUS) ×3 IMPLANT
SPONGE LAP 18X18 X RAY DECT (DISPOSABLE) ×1 IMPLANT
SPONGE LAP 4X18 X RAY DECT (DISPOSABLE) ×3 IMPLANT
STAPLER VISISTAT 35W (STAPLE) ×3 IMPLANT
STENT CONTOUR 6FRX26X.038 (STENTS) ×2 IMPLANT
SURGILUBE 3G PEEL PACK STRL (MISCELLANEOUS) ×12 IMPLANT
SUT ETHILON 3 0 PS 1 (SUTURE) ×3 IMPLANT
SUT MNCRL 3 0 RB1 (SUTURE) IMPLANT
SUT MNCRL 3 0 VIOLET RB1 (SUTURE) ×1 IMPLANT
SUT MNCRL AB 4-0 PS2 18 (SUTURE) ×4 IMPLANT
SUT MONOCRYL 3 0 RB1 (SUTURE) ×6
SUT VIC AB 0 CT1 27 (SUTURE) ×9
SUT VIC AB 0 CT1 27XBRD ANTBC (SUTURE) ×3 IMPLANT
SUT VIC AB 0 UR5 27 (SUTURE) ×6 IMPLANT
SUT VICRYL 0 UR6 27IN ABS (SUTURE) ×6 IMPLANT
SUT VLOC BARB 180 ABS3/0GR12 (SUTURE) ×3
SUTURE VLOC BRB 180 ABS3/0GR12 (SUTURE) IMPLANT
SYRINGE 10CC LL (SYRINGE) ×2 IMPLANT
SYRINGE IRR TOOMEY STRL 70CC (SYRINGE) IMPLANT
TOWEL OR NON WOVEN STRL DISP B (DISPOSABLE) ×3 IMPLANT
TRAY FOLEY W/METER SILVER 14FR (SET/KITS/TRAYS/PACK) ×1 IMPLANT
TRAY FOLEY W/METER SILVER 16FR (SET/KITS/TRAYS/PACK) ×3 IMPLANT
TRAY LAPAROSCOPIC (CUSTOM PROCEDURE TRAY) ×3 IMPLANT
TROCAR BLADELESS OPT 12M 150M (ENDOMECHANICALS) ×3 IMPLANT
TROCAR BLADELESS OPT 5 100 (ENDOMECHANICALS) ×3 IMPLANT
TUBE FEEDING 8FR 16IN STR KANG (MISCELLANEOUS) ×3 IMPLANT
TUBING CONNECTING 10 (TUBING) ×2 IMPLANT
TUBING CONNECTING 10' (TUBING) ×1
TUBING INSUFFLATION 10FT LAP (TUBING) ×3 IMPLANT
WATER STERILE IRR 1500ML POUR (IV SOLUTION) ×6 IMPLANT
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
## 2015-07-30 NOTE — Anesthesia Preprocedure Evaluation (Signed)
Anesthesia Evaluation  Patient identified by MRN, date of birth, ID band Patient awake    Reviewed: Allergy & Precautions, NPO status , Patient's Chart, lab work & pertinent test results  Airway Mallampati: II  TM Distance: >3 FB Neck ROM: Full    Dental no notable dental hx.    Pulmonary asthma , Current Smoker,    Pulmonary exam normal breath sounds clear to auscultation       Cardiovascular negative cardio ROS Normal cardiovascular exam Rhythm:Regular Rate:Normal     Neuro/Psych negative neurological ROS  negative psych ROS   GI/Hepatic negative GI ROS, Neg liver ROS,   Endo/Other  negative endocrine ROS  Renal/GU negative Renal ROS  negative genitourinary   Musculoskeletal negative musculoskeletal ROS (+)   Abdominal   Peds negative pediatric ROS (+)  Hematology negative hematology ROS (+)   Anesthesia Other Findings   Reproductive/Obstetrics negative OB ROS                             Anesthesia Physical Anesthesia Plan  ASA: II  Anesthesia Plan: General   Post-op Pain Management:    Induction: Intravenous  Airway Management Planned: Oral ETT  Additional Equipment:   Intra-op Plan:   Post-operative Plan: Extubation in OR  Informed Consent: I have reviewed the patients History and Physical, chart, labs and discussed the procedure including the risks, benefits and alternatives for the proposed anesthesia with the patient or authorized representative who has indicated his/her understanding and acceptance.   Dental advisory given  Plan Discussed with: CRNA  Anesthesia Plan Comments:         Anesthesia Quick Evaluation  

## 2015-07-30 NOTE — Transfer of Care (Signed)
Immediate Anesthesia Transfer of Care Note  Patient: Austin Church  Procedure(s) Performed: Procedure(s): ROBOTIC ASSISTED PYELOPLASTY WITH STENT PLACEMENT (Right) CYSTOSCOPY WITH RETROGRADE PYELOGRAM/URETERAL STENT PLACEMENT (Right)  Patient Location: PACU  Anesthesia Type:General  Level of Consciousness:  sedated, patient cooperative and responds to stimulation  Airway & Oxygen Therapy:Patient Spontanous Breathing and Patient connected to face mask oxgen  Post-op Assessment:  Report given to PACU RN and Post -op Vital signs reviewed and stable  Post vital signs:  Reviewed and stable  Last Vitals:  Filed Vitals:   07/30/15 0641  BP: 144/80  Pulse: 74  Temp: 36.6 C  Resp: 18    Complications: No apparent anesthesia complications

## 2015-07-30 NOTE — Progress Notes (Signed)
Post op Check:  S: POD 0 after uncomplicated right robotic pyeloplasty. Continues to c/o pain and pt's mother continues to ask for stronger than typical narcotic pain medications by name.   O: NAD, RRR SNTND, port sites c/d/i JP site with scant serosanguinous output Foley with pink urine, no large clots SCD's in place  A/P: Doing well POD 1. Begin scheduled Tyelenol 1000mg  Q6 and Dilaudid PCA for his subjective symptoms.

## 2015-07-30 NOTE — Progress Notes (Signed)
Wasted 7ml of hydromorphone in sink with Debroah LoopKatie Dunkelberger RN to prime PCA tubing.

## 2015-07-30 NOTE — Anesthesia Postprocedure Evaluation (Signed)
  Anesthesia Post-op Note  Patient: Austin Church  Procedure(s) Performed: Procedure(s) (LRB): ROBOTIC ASSISTED PYELOPLASTY WITH STENT PLACEMENT (Right) CYSTOSCOPY WITH RETROGRADE PYELOGRAM/URETERAL STENT PLACEMENT (Right)  Patient Location: PACU  Anesthesia Type: General  Level of Consciousness: awake and alert   Airway and Oxygen Therapy: Patient Spontanous Breathing  Post-op Pain: mild  Post-op Assessment: Post-op Vital signs reviewed, Patient's Cardiovascular Status Stable, Respiratory Function Stable, Patent Airway and No signs of Nausea or vomiting. Required increased pain meds in PACU. Of note, on percocet at home for three months.  Last Vitals:  Filed Vitals:   07/30/15 1335  BP: 153/83  Pulse: 100  Temp: 36.8 C  Resp: 16    Post-op Vital Signs: stable   Complications: No apparent anesthesia complications

## 2015-07-30 NOTE — Brief Op Note (Signed)
07/30/2015  11:18 AM  PATIENT:  Austin Church  19 y.o. male  PRE-OPERATIVE DIAGNOSIS:  RIGHT URETEROPELVIC JUNCTION OBSTRUCTION  POST-OPERATIVE DIAGNOSIS:  RIGHT URETEROPELVIC JUNCTION OBSTRUCTION  PROCEDURE:  Procedure(s): ROBOTIC ASSISTED PYELOPLASTY WITH STENT PLACEMENT (Right) CYSTOSCOPY WITH RETROGRADE PYELOGRAM/URETERAL STENT PLACEMENT (Right)  SURGEON:  Surgeon(s) and Role:    * Sebastian Acheheodore Slyvester Latona, MD - Primary  PHYSICIAN ASSISTANT:   ASSISTANTS: 1 - Greggory BrandyPeter Greene MD   ANESTHESIA:   local and general  EBL:  Total I/O In: 1000 [I.V.:1000] Out: 200 [Urine:150; Blood:50]  BLOOD ADMINISTERED:none  DRAINS: JP to bulb, Foley to gravity   LOCAL MEDICATIONS USED:  MARCAINE     SPECIMEN:  Source of Specimen:  redundant right renal pelvis  DISPOSITION OF SPECIMEN:  PATHOLOGY  COUNTS:  YES  TOURNIQUET:  * No tourniquets in log *  DICTATION: .Other Dictation: Dictation Number [id number]  PLAN OF CARE: Admit to inpatient   PATIENT DISPOSITION:  PACU - hemodynamically stable.   Delay start of Pharmacological VTE agent (>24hrs) due to surgical blood loss or risk of bleeding: yes

## 2015-07-30 NOTE — Anesthesia Procedure Notes (Signed)
Procedure Name: Intubation Date/Time: 07/30/2015 8:38 AM Performed by: Orest DikesPETERS, Dade Rodin J Pre-anesthesia Checklist: Patient identified, Emergency Drugs available, Suction available and Patient being monitored Patient Re-evaluated:Patient Re-evaluated prior to inductionOxygen Delivery Method: Circle System Utilized Preoxygenation: Pre-oxygenation with 100% oxygen Intubation Type: IV induction Ventilation: Mask ventilation without difficulty Laryngoscope Size: Mac and 4 Grade View: Grade I Tube type: Oral Tube size: 7.5 mm Number of attempts: 1 Airway Equipment and Method: Stylet Placement Confirmation: ETT inserted through vocal cords under direct vision,  positive ETCO2 and breath sounds checked- equal and bilateral Secured at: 21 cm Tube secured with: Tape Dental Injury: Teeth and Oropharynx as per pre-operative assessment

## 2015-07-31 LAB — BASIC METABOLIC PANEL
ANION GAP: 11 (ref 5–15)
BUN: 13 mg/dL (ref 6–20)
CALCIUM: 9.2 mg/dL (ref 8.9–10.3)
CO2: 27 mmol/L (ref 22–32)
CREATININE: 1.26 mg/dL — AB (ref 0.61–1.24)
Chloride: 100 mmol/L — ABNORMAL LOW (ref 101–111)
Glucose, Bld: 122 mg/dL — ABNORMAL HIGH (ref 65–99)
Potassium: 3.9 mmol/L (ref 3.5–5.1)
SODIUM: 138 mmol/L (ref 135–145)

## 2015-07-31 LAB — HEMOGLOBIN AND HEMATOCRIT, BLOOD
HEMATOCRIT: 42.1 % (ref 39.0–52.0)
Hemoglobin: 13.3 g/dL (ref 13.0–17.0)

## 2015-07-31 MED ORDER — POLYETHYLENE GLYCOL 3350 17 G PO PACK
17.0000 g | PACK | Freq: Every day | ORAL | Status: DC
  Administered 2015-07-31: 17 g via ORAL
  Filled 2015-07-31: qty 1

## 2015-07-31 NOTE — Op Note (Signed)
NAMEJONAH, Austin Church             ACCOUNT NO.:  [id number]  MEDICAL RECORD NO.:  [id number]  LOCATION:  1439                         FACILITY:  [facility]  PHYSICIAN:  Austin Ache, MD     DATE OF BIRTH:  1996/03/30  DATE OF PROCEDURE:  07/30/2015                              OPERATIVE REPORT  DIAGNOSIS.:  Right ureteropelvic junction with recurrent flank pain.  PROCEDURE: 1. Cystoscopy with right retrograde pyelogram and interpretation. 2. Insertion of right ureteral stent 6 x 26 contoured, no tether. 3. Right robotic-assisted laparoscopic pyeloplasty.  ASSISTANT:  Greggory Brandy, MD.  ESTIMATED BLOOD LOSS:  Nil.  COMPLICATIONS:  None.  SPECIMENS:  Right redundant renal pelvis for permanent pathology.  FINDINGS: 1. Significant hydronephrosis without ureteronephrosis consistent with     known UPJ obstruction. 2. Crossing vessel type UPJ obstruction on the right as expected with     significant hydronephrosis without ureteronephrosis. 3. Dismembered type subtraction pyeloplasty with transposition of the     ureter anterior to the crossing vessels and removal of redundant     renal pelvis with apparent successful geometry without undue     tension.  DRAINS: 1. Jackson-Pratt drain to bulb suction. 2. Foley catheter to straight drain.  INDICATION:  Austin Church is a pleasant 19 year old young man who was found incidentally on workup after trauma to have a significant right hydronephrosis without ureteronephrosis.  He had what appeared to be crossing vessel on axial imaging.  He did endorse retrospectively some recurrent right flank pain.  He underwent renogram, which corroborated high-grade right ureteral obstruction, but preserved right renal function with 44% relative function.  Options were discussed for management including surveillance versus chronic draining versus definitive pyeloplasty with and without minimally invasive assistance. The patient wishes to proceed with a  minimally invasive pyeloplasty. Informed consent was obtained and placed in the medical record.  PROCEDURE IN DETAIL:  The patient being Austin Church verified. Procedure being right robotic pyeloplasty was confirmed.  Procedure was carried out.  Time-out was performed.  Intravenous antibiotics were administered.  General endotracheal anesthesia was introduced.  The patient was placed into a low lithotomy position.  Sterile field was created by prepping and draping the patient's penis, perineum, and proximal thighs using iodine x3.  Next, cystourethroscopy was performed using 23-French rigid cystoscope with 30-degree offset lens.  Inspection of the anterior and posterior urethra unremarkable.  Inspection of the urinary bladder revealed no diverticula, calcifications, or papular lesions.  Ureteral orifices were noted and appeared [place].  The right ureteral orifice was cannulated with a 6-French end-hole catheter and right retrograde pyelogram was obtained.  Right retrograde pyelogram demonstrated a single right ureter with single system right kidney.  There is severe hydronephrosis without ureteronephrosis consistent with likely UPJ obstruction.  A 0.038 Sensor wire was advanced to the level of the upper pole over which a new 6 x 26 Contour-type stent was placed.  Good proximal and distal deployment were noted.  The cystoscope was exchanged for a new 16-French catheter per urethra to straight drain.  The patient was then repositioned into a right side up full-flank position applying 15 degrees of stable flexion, superior arm elevator, axillary roll, compression devices, and  beanbag, bottom leg bent, top leg straight.  He was further fashioned on the operative table using 3-inch tape over foam padding across his chest and pelvis.  A sterile field was created by first clipper shaving and then prepping and draping the patient's right flank and abdomen using chlorhexidine gluconate.   Next, a high-flow low pressure pneumoperitoneum was obtained using Veress technique in the right lower quadrant having passed the aspiration and drop test.  Next, a 12-mm robotic camera port was placed and positioned approximately 4 fingerbreadths superolateral to the umbilicus.  Laparoscopic examination of the peritoneal cavity revealed no significant adhesions and no visceral injury.  Additional ports were placed as follows:  Right subcostal 8-mm robotic port, right far lateral 8-mm robotic port, 4 fingerbreadths superomedial to the anterior iliac spine, right inferior paramedian robotic port approximately 1 handbreadth superior to the pubic ramus.  A 5 mm assistant port in the midline just below the umbilicus.  A 12-mm assistant port in the midline approximately 2 fingerbreadths above the camera port and a 5 mm liver retraction port in a subxiphoid location.  A self-locking retractor was used to the elevate the inferior surface of the liver off the anterior surface of the kidney.  Robot was docked and passed through electronic checks.  Initial attention was directed at the development of right retroperitoneum. Incision was made lateral to the ascending colon from the cecum towards the area of the hepatic flexure and it was carefully mobilized medially. The patient had a retrocecal appendix, which was also carefully mobilized medially and rotated somewhat out of this retrocecal location. The lower pole of the kidney was identified, placed on gentle lateral traction.  The duodenum was already very medial and did not require formal kocherization.  Dissection proceeded medial to the lower pole of the kidney.  Ureter was encountered, dissected superiorly.  It was normal-caliber at this point.  As expected, there was a dominant superior renal artery and vein as well as a significant lower pole crossing vessel that was anterior to the UPJ.  There was a significant amount of decompressed  redundant renal pelvis above this area.  We felt that this was consistent with the site of obstruction.  As such, further mobilization was performed of the redundant renal pelvis and UPJ area and kocherization was performed of the UPJ at an angle and the ureter was transposed anterior to the crossing vessel.  Additional spatulation was performed such that the ureter had approximately 1 cm area of anastomosis.  The renal pelvis was also rotated to position anterior to the lower pole crossing vessel, but it was quite redundant as expected, substraction was performed by excising approximately 5 cm2 segment of redundant tissue after identifying the most inferior aspect to become the site of ureteral anastomosis.  Ureteral anastomosis was performed first with a single 3-0 Monocryl suture at the heel followed by two separate running suture lines of 3-0 Monocryl performing mucosa to mucosa ureteral renal pelvis anastomosis taking great care to avoid inclusion of the ureteral stent into the suture line.  The free end of the renal pelvis which was again approximately 4 cm in length was closed using running 3-0 V-Loc suture.  Following these maneuvers, there was excellent geometric configuration of the UPJ anterior to the crossing vessel.  Hemostasis appeared excellent.  All sponge and needle counts were correct.  Retroperitoneum was reapproximated by single green clip bringing tissue back over the area of the UPJ.  Robot was then undocked after a  closed suction drain was brought through the lateral most robotic port site in the area of the peritoneal cavity.  The 12-mm camera port site and assistant port sites were closed at the level of fascia using interrupted Vicryl.  All incision sites were infiltrated with dilute lyophilized Marcaine and closed at the level of skin using subcuticular Monocryl followed by Dermabond.  Procedure was then terminated.  The patient tolerated the procedure well.   There were no immediate periprocedural complications.  The patient was taken to the postanesthesia care unit in stable condition.          ______________________________ Austin Acheheodore Ulus Hazen, MD     TM/MEDQ  D:  07/30/2015  T:  07/31/2015  Job:  [id number]

## 2015-07-31 NOTE — Progress Notes (Signed)
1 Day Post-Op  Subjective:  1 - Right UPJ Obstruction - s/p right robotic nephrecotmy on 10/26, day of admission, withtou acute complications. Foley removed POD 1 as JP output scant. Initially managed post-op pain with PCA as very low pain tollerance.  Today Austin Church is w/o complints. Tollerating PO, no fevers, Hgb and Cr excellent. JP output scant.  Objective: Vital signs in last 24 hours: Temp:  [97.5 F (36.4 C)-99.1 F (37.3 C)] 97.9 F (36.6 C) (10/27 0604) Pulse Rate:  [76-100] 76 (10/27 0604) Resp:  [15-21] 18 (10/27 0604) BP: (123-153)/(73-99) 123/73 mmHg (10/27 0604) SpO2:  [99 %-100 %] 100 % (10/27 0604) Last BM Date: 07/30/15  Intake/Output from previous day: 10/26 0701 - 10/27 0700 In: 2430 [P.O.:480; I.V.:1800; IV Piggyback:150] Out: 2180 [Urine:2100; Drains:30; Blood:50] Intake/Output this shift:    General appearance: alert, cooperative, appears stated age and mother and girlfirend at bedside Head: Normocephalic, without obvious abnormality, atraumatic Eyes: negative Nose: Nares normal. Septum midline. Mucosa normal. No drainage or sinus tenderness. Throat: lips, mucosa, and tongue normal; teeth and gums normal Neck: supple, symmetrical, trachea midline Back: symmetric, no curvature. ROM normal. No CVA tenderness. Resp: non-labored Cardio: Nl rate GI: soft, non-tender; bowel sounds normal; no masses,  no organomegaly Male genitalia: normal, foley removed. Urine in bag light pink prior to removal Extremities: extremities normal, atraumatic, no cyanosis or edema Pulses: 2+ and symmetric Skin: Skin color, texture, turgor normal. No rashes or lesions Lymph nodes: Cervical, supraclavicular, and axillary nodes normal. Neurologic: Grossly normal Incision/Wound:  Lab Results:   Recent Labs  07/29/15 1145 07/31/15 0543  WBC 5.0  --   HGB 15.2 13.3  HCT 47.4 42.1  PLT 160  --    BMET  Recent Labs  07/29/15 1145 07/31/15 0543  NA 140 138  K 4.5 3.9   CL 105 100*  CO2 28 27  GLUCOSE 97 122*  BUN 15 13  CREATININE 1.38* 1.26*  CALCIUM 9.7 9.2   PT/INR No results for input(s): LABPROT, INR in the last 72 hours. ABG No results for input(s): PHART, HCO3 in the last 72 hours.  Invalid input(s): PCO2, PO2  Studies/Results: Dg Retrograde Pyelogram  07/30/2015  CLINICAL DATA:  19 year old male with a history of right-sided pyeloplasty EXAM: RETROGRADE PYELOGRAM COMPARISON:  CT 06/21/2015 FINDINGS: Fluoroscopic spot images during retrograde pyelogram. Initial images demonstrate cannulation of the right ureteral orifice and retrograde infusion of contrast. Distal third of the ureter within the anatomic pelvis of unremarkable caliber. There is a caliber change of the ureter in the segment overlying the right sacrum. The more proximal 2/3 of the ureter is of smaller caliber with the narrowing identified at the transition into the distal third of the ureter. Subsequent images demonstrate partial opacification of the right-sided collecting system with moderate to advanced pelvicaliectasis. Final image demonstrates double-J ureteral stent in place with the proximal loop formed in the upper pole collecting system and the distal loop formed in the region of the urinary bladder. IMPRESSION: Intraoperative right-sided retrograde pyelogram demonstrates moderate to severe right-sided pelvicaliectasis with placement of a double-J ureteral stent. Caliber change of the right ureter involving the distal third, may represent spasm or stricture. Please refer to the dictated operative report for full details of intraoperative findings and procedure. Signed, Yvone Neu. Loreta Ave, DO [facility] Electronically Signed   By: Gilmer Mor D.O.   On: 07/30/2015 11:15    Anti-infectives: Anti-infectives    Start  Dose/Rate Route Frequency Ordered Stop   07/30/15 1700  ceFAZolin (ANCEF) IVPB 1 g/50 mL premix     1  g 100 mL/hr over 30 Minutes Intravenous Every 8 hours 07/30/15 1212 07/31/15 0118   07/30/15 0000  cefTRIAXone (ROCEPHIN) 2 g in dextrose 5 % 50 mL IVPB     2 g 100 mL/hr over 30 Minutes Intravenous 30 min pre-op 07/29/15 1415 07/30/15 0845      Assessment/Plan:    1 - Right UPJ Obstruction - doing well POD 1. DC foley, ambulated, adv diet. Plan on JP removal later today v. Tomorrow as long as output remains scant now that foley out. Continue PCA + scheduled tyelenol for now.  Possible DC PM today v. Tomorrow based on current progress.   [facility] TexasMANNY, Jahanna Raether 07/31/2015

## 2015-08-01 MED ORDER — CEPHALEXIN 500 MG PO CAPS: 500.0000 mg | ORAL_CAPSULE | Freq: Two times a day (BID) | ORAL | Status: DC

## 2015-08-01 MED ORDER — OXYCODONE-ACETAMINOPHEN 5-325 MG PO TABS: 1.0000 | ORAL_TABLET | Freq: Four times a day (QID) | ORAL | Status: DC | PRN

## 2015-08-01 MED ORDER — SENNOSIDES-DOCUSATE SODIUM 8.6-50 MG PO TABS: 1.0000 | ORAL_TABLET | Freq: Two times a day (BID) | ORAL | Status: DC

## 2015-08-01 NOTE — Discharge Summary (Signed)
Physician Discharge Summary  Patient ID: Austin Church MRN: [id number] DOB/AGE: 19-30-1997 19 y.o.  Admit date: 07/30/2015 Discharge date: 08/01/2015  Admission Diagnoses: Rt UPJ Obstruction  Discharge Diagnoses:  Active Problems:   UPJ obstruction, congenital   Discharged Condition: good  Hospital Course:   1 - Rt UPJ Obstruction - Pt underwent uncomplicated right robotic dismembered pyeloplasty with Rt ureteral stent placement on 10/26, the day of admission. Pt with very low pain tollerance and some narcotic use pre-op therefore initially managed on PCA + scheduled tylenol that was transitioned to PO meds by time of discharge. Foley removed POD 1 as JP output minimal and JP removed PM of POD 1 as output remained scant after foley removal. By POD 2, the day of discharge, he is ambulatory, pain controlled on PO meds, tolerating PO intake, voiding, and felt to be adequate for discharge.   Consults: None  Significant Diagnostic Studies: labs: Cr <1.5. Hgb >10 at discharge.  Treatments: surgery: right robotic dismembered pyeloplasty with Rt ureteral stent placement on 10/26  Discharge Exam: Blood pressure 135/81, pulse 64, temperature 98 F (36.7 C), temperature source Oral, resp. rate 20, height  (1.753 m), weight 87.544 kg (193 lb), SpO2 100 %. General appearance: alert, cooperative, appears stated age and girlfriend and family at bedside Eyes: negative Nose: Nares normal. Septum midline. Mucosa normal. No drainage or sinus tenderness. Throat: lips, mucosa, and tongue normal; teeth and gums normal Neck: supple, symmetrical, trachea midline Back: symmetric, no curvature. ROM normal. No CVA tenderness. Resp: non-labored on room air Cardio: Nl rate GI: soft, non-tender; bowel sounds normal; no masses,  no organomegaly Male genitalia: normal Extremities: extremities normal, atraumatic, no cyanosis or edema Pulses: 2+ and symmetric Skin: Skin color, texture, turgor normal.  No rashes or lesions Lymph nodes: Cervical, supraclavicular, and axillary nodes normal. Neurologic: Grossly normal Incision/Wound: Recent port sites c/d/i. Prior JP site with dry dressing.  Disposition: 01-Home or Self Care     Medication List    STOP taking these medications        traMADol 50 MG tablet  Commonly known as:  ULTRAM      TAKE these medications        cephALEXin 500 MG capsule  Commonly known as:  KEFLEX  Take 1 capsule (500 mg total) by mouth 2 (two) times daily. X 3 days. Begin day before next Urology appointment.     ondansetron 4 MG tablet  Commonly known as:  ZOFRAN  Take 1 tablet (4 mg total) by mouth every 6 (six) hours.     oxyCODONE-acetaminophen 5-325 MG tablet  Commonly known as:  PERCOCET/ROXICET  Take 1-2 tablets by mouth every 6 (six) hours as needed for moderate pain or severe pain. Post-operatively     permethrin 5 % cream  Commonly known as:  ELIMITE  Apply to entire body below the neck and leave on for 8 hours then wash off--repeat in 1 week     senna-docusate 8.6-50 MG tablet  Commonly known as:  Senokot-S  Take 1 tablet by mouth 2 (two) times daily. While taking pain meds to prevent constipation           Follow-up Information    Follow up with Sebastian Ache, MD On 08/18/2015.   Specialty:  Urology   Why:  at 2pm for MD visit and office stent removal   Contact information:   [address] [place] [address] [phone]       Signed: Berneice Heinrich,  Arista Kettlewell 08/01/2015, 6:42 AM

## 2015-08-01 NOTE — Discharge Instructions (Signed)

## 2015-12-12 ENCOUNTER — Encounter (HOSPITAL_COMMUNITY): Payer: Self-pay

## 2015-12-12 ENCOUNTER — Emergency Department (HOSPITAL_COMMUNITY)
Admission: EM | Admit: 2015-12-12 | Discharge: 2015-12-12 | Disposition: A | Discharge status: Home / Self Care | Payer: Medicaid Other | Attending: Emergency Medicine | Admitting: Emergency Medicine

## 2015-12-12 DIAGNOSIS — J45909 Unspecified asthma, uncomplicated: Secondary | ICD-10-CM | POA: Insufficient documentation

## 2015-12-12 DIAGNOSIS — F17210 Nicotine dependence, cigarettes, uncomplicated: Secondary | ICD-10-CM | POA: Insufficient documentation

## 2015-12-12 DIAGNOSIS — A389 Scarlet fever, uncomplicated: Secondary | ICD-10-CM | POA: Diagnosis not present

## 2015-12-12 DIAGNOSIS — R21 Rash and other nonspecific skin eruption: Secondary | ICD-10-CM | POA: Diagnosis present

## 2015-12-12 LAB — RAPID STREP SCREEN (MED CTR MEBANE ONLY): Streptococcus, Group A Screen (Direct): NEGATIVE

## 2015-12-12 MED ORDER — DIPHENHYDRAMINE HCL 50 MG/ML IJ SOLN
25.0000 mg | Freq: Once | INTRAMUSCULAR | Status: AC
  Administered 2015-12-12: 25 mg via INTRAVENOUS
  Filled 2015-12-12: qty 1

## 2015-12-12 MED ORDER — KETOROLAC TROMETHAMINE 30 MG/ML IJ SOLN
30.0000 mg | Freq: Once | INTRAMUSCULAR | Status: AC
  Administered 2015-12-12: 30 mg via INTRAVENOUS
  Filled 2015-12-12: qty 1

## 2015-12-12 MED ORDER — IPRATROPIUM-ALBUTEROL 0.5-2.5 (3) MG/3ML IN SOLN
3.0000 mL | RESPIRATORY_TRACT | Status: DC
  Administered 2015-12-12: 3 mL via RESPIRATORY_TRACT
  Filled 2015-12-12 (×2): qty 3

## 2015-12-12 MED ORDER — SODIUM CHLORIDE 0.9 % IV BOLUS (SEPSIS)
1000.0000 mL | Freq: Once | INTRAVENOUS | Status: AC
  Administered 2015-12-12: 1000 mL via INTRAVENOUS

## 2015-12-12 MED ORDER — DEXAMETHASONE 6 MG PO TABS: 12.0000 mg | ORAL_TABLET | Freq: Once | ORAL | Status: AC | PRN

## 2015-12-12 MED ORDER — ONDANSETRON HCL 4 MG PO TABS: 4.0000 mg | ORAL_TABLET | Freq: Three times a day (TID) | ORAL | Status: AC | PRN

## 2015-12-12 MED ORDER — DEXAMETHASONE SODIUM PHOSPHATE 10 MG/ML IJ SOLN
10.0000 mg | Freq: Once | INTRAMUSCULAR | Status: AC
Start: 2015-12-12 — End: 2015-12-12
  Administered 2015-12-12: 10 mg via INTRAVENOUS
  Filled 2015-12-12: qty 1

## 2015-12-12 MED ORDER — AMOXICILLIN-POT CLAVULANATE 875-125 MG PO TABS
1.0000 | ORAL_TABLET | Freq: Once | ORAL | Status: AC
  Administered 2015-12-12: 1 via ORAL
  Filled 2015-12-12: qty 1

## 2015-12-12 MED ORDER — AMOXICILLIN-POT CLAVULANATE 875-125 MG PO TABS: 1.0000 | ORAL_TABLET | Freq: Two times a day (BID) | ORAL | Status: AC

## 2015-12-12 NOTE — Discharge Instructions (Signed)
Patient education: Scarlet fever (The Basics) View in Spanish Written by the doctors and editors at [employer] What is scarlet fever? -- Scarlet fever is a condition that causes a red rash. People with scarlet fever usually also have a sore throat. This is because scarlet fever is caused by the bacteria that cause a condition called "strep throat." A person with scarlet fever has a red rash and usually has strep throat, too.  Strep throat is common in children. But not all children with strep throat get scarlet fever.  What are the symptoms of scarlet fever? -- The symptoms include:  ?Rash - This usually starts on the head and neck and spreads to the body, arms, and legs. It causes: Red spots that turn white when you press on the skin Small, raised bumps - Skin might feel rough, like sandpaper. Peeling skin - This happens when the rash is going away. ?Bright red tongue with small bumps on it - Doctors call this a "strawberry tongue" (picture 1). A person with scarlet fever might also have symptoms of strep throat. These can include:  ?A sore throat - This might start suddenly. ?White patches in the back of the throat ?Swollen glands in the neck ?Fever These symptoms usually get better in 2 to 5 days.  Should I see a doctor or nurse? -- If you or your child has a sore throat and rash, call your doctor or nurse.  Is there a test for scarlet fever? -- Yes. The doctor or nurse will do an exam and look at the rash. He or she can also check for the bacteria that cause strep throat and scarlet fever. The doctor or nurse will run a swab (Q-Tip) along the back of your child's throat, and test it for the bacteria.  How is scarlet fever treated? -- Scarlet fever is treated with antibiotics. The antibiotics reduce the symptoms of scarlet fever and keep it from spreading to other people.  Antibiotics also keep scarlet fever from causing other health problems. In some people, the bacteria that cause  scarlet fever can cause a serious disease called "acute rheumatic fever." This disease damages the heart and causes other problems. The heart damage gets worse over time.  Children between the ages of 5 and 7115 are the most likely to get acute rheumatic fever. If your child gets scarlet fever, he or she needs antibiotics. The antibiotics kill the bacteria so they do not cause acute rheumatic fever.  More on this topic  Patient education: Strep throat in children (The Basics) Patient education: Sore throat in children (The Basics)  Patient education: Sore throat in children (Beyond the Basics) Patient education: Sore throat in adults (Beyond the Basics)  All topics are updated as new evidence becomes available and our peer review process is complete. This topic retrieved from [employer] on:Dec 12, 2015.

## 2015-12-12 NOTE — ED Provider Notes (Signed)
CSN: [id number]     Arrival date & time 12/12/15  1049 History   First MD Initiated Contact with Patient 12/12/15 1220     Chief Complaint  Patient presents with  . Rash  . trouble swallowing   . Lymphadenopathy     (Consider location/radiation/quality/duration/timing/severity/associated sxs/prior Treatment) Patient is a 20 y.o. male presenting with rash.  Rash Location:  Torso Quality: redness   Severity:  Moderate Onset quality:  Gradual Duration:  2 days Timing:  Constant Chronicity:  New Context: not eggs and not sick contacts   Relieved by:  None tried Worsened by:  Nothing tried Ineffective treatments:  None tried Associated symptoms: fever, myalgias and URI   Associated symptoms: no abdominal pain and no shortness of breath     Past Medical History  Diagnosis Date  . Seasonal allergies   . Nausea and vomiting     intermittent with abdominal pain  . Asthma     no routine meds used  . Stricture, ureter 0ct 2016   Past Surgical History  Procedure Laterality Date  . Finger surgery    . Robot assisted pyeloplasty Right 07/30/2015    Procedure: ROBOTIC ASSISTED PYELOPLASTY WITH STENT PLACEMENT;  Surgeon: Sebastian Acheheodore Manny, MD;  Location: [facility];  Service: Urology;  Laterality: Right;  . Cystoscopy w/ ureteral stent placement Right 07/30/2015    Procedure: CYSTOSCOPY WITH RETROGRADE PYELOGRAM/URETERAL STENT PLACEMENT;  Surgeon: Sebastian Acheheodore Manny, MD;  Location: [facility];  Service: Urology;  Laterality: Right;   Family History  Problem Relation Age of Onset  . Cancer Other    Social History  Substance Use Topics  . Smoking status: Current Some Day Smoker -- 0.25 packs/day for 10 years    Types: Cigarettes  . Smokeless tobacco: None  . Alcohol Use: No    Review of Systems  Constitutional: Positive for fever.  Eyes: Negative for pain.  Respiratory: Negative for cough and shortness of breath.   Cardiovascular: Negative for chest pain.  Gastrointestinal: Negative for  abdominal pain.  Endocrine: Negative for polydipsia and polyuria.  Genitourinary: Negative for dysuria.  Musculoskeletal: Positive for myalgias.  Skin: Positive for rash.  All other systems reviewed and are negative.     Allergies  Pineapple  Home Medications   Prior to Admission medications   Medication Sig Start Date End Date Taking? Authorizing Provider  dextromethorphan-guaiFENesin (MUCINEX DM) 30-600 MG 12hr tablet Take 1 tablet by mouth 2 (two) times daily as needed for cough.   Yes Historical Provider, MD  phenol (CHLORASEPTIC) 1.4 % LIQD Use as directed 1 spray in the mouth or throat every 6 (six) hours as needed for throat irritation / pain.   Yes Historical Provider, MD  PROAIR HFA 108 [phone](90 Base) MCG/ACT inhaler Inhale 2 puffs into the lungs every 4 (four) hours as needed for wheezing or shortness of breath.  11/17/15  Yes Historical Provider, MD  amoxicillin-clavulanate (AUGMENTIN) 875-125 MG tablet Take 1 tablet by mouth 2 (two) times daily. 12/12/15   Marily MemosJason Babak Lucus, MD  dexamethasone (DECADRON) 6 MG tablet Take 2 tablets (12 mg total) by mouth once as needed (sore throat). If sore throat returns/still not improved 12/15/15   Marily MemosJason Jentri Aye, MD  ondansetron (ZOFRAN) 4 MG tablet Take 1 tablet (4 mg total) by mouth every 8 (eight) hours as needed for nausea or vomiting. 12/12/15   Marily MemosJason Margi Edmundson, MD   BP 131/72 mmHg  Pulse 98  Temp(Src) 99.2 F (37.3 C) (Oral)  Resp 18  SpO2 96%  Physical Exam  Constitutional: He appears well-developed and well-nourished.  HENT:  Head: Normocephalic and atraumatic.  Neck: Normal range of motion.  Cardiovascular: Normal rate and regular rhythm.   Pulmonary/Chest: Effort normal. No respiratory distress.  Abdominal: Soft. He exhibits no distension. There is no tenderness.  Musculoskeletal: Normal range of motion. He exhibits no edema or tenderness.  Neurological: He is alert. No cranial nerve deficit.  Skin: Skin is warm and dry. Rash noted.   Nursing note and vitals reviewed.   ED Course  Procedures (including critical care time) Labs Review Labs Reviewed  RAPID STREP SCREEN (NOT AT [facility])  CULTURE, GROUP A STREP [facility])    Imaging Review No results found. I have personally reviewed and evaluated these images and lab results as part of my medical decision-making.   EKG Interpretation None      MDM   Final diagnoses:  Scarlet fever   Likely scarlet fever. Strep negative, but treated anyway based on pathognomic rash and sore throat to prevent rheumatic fever. Odynophagia improved with Decadron. Tolerate by mouth prior to discharge. Heart rate improved with pain control as well.  I have personally and contemperaneously reviewed labs and imaging and used in my decision making as above.   A medical screening exam was performed and I feel the patient has had an appropriate workup for their chief complaint at this time and likelihood of emergent condition existing is low. Their vital signs are stable. They have been counseled on decision, discharge, follow up and which symptoms necessitate immediate return to the emergency department.  They verbally stated understanding and agreement with plan and discharged in stable condition.      Marily Memos, MD 12/13/15 [phone]

## 2015-12-12 NOTE — ED Notes (Signed)
Patient c/o pain when swallowing, rash on body, chills since yesterday. Patient was exposed to strep throat a month ago.

## 2015-12-14 LAB — CULTURE, GROUP A STREP (THRC)

## 2016-07-23 ENCOUNTER — Encounter (HOSPITAL_BASED_OUTPATIENT_CLINIC_OR_DEPARTMENT_OTHER): Payer: Self-pay | Admitting: *Deleted

## 2016-07-23 ENCOUNTER — Emergency Department (HOSPITAL_BASED_OUTPATIENT_CLINIC_OR_DEPARTMENT_OTHER)
Admission: EM | Admit: 2016-07-23 | Discharge: 2016-07-24 | Disposition: A | Discharge status: Home / Self Care | Payer: Medicaid Other | Attending: Emergency Medicine | Admitting: Emergency Medicine

## 2016-07-23 DIAGNOSIS — J45909 Unspecified asthma, uncomplicated: Secondary | ICD-10-CM | POA: Insufficient documentation

## 2016-07-23 DIAGNOSIS — R112 Nausea with vomiting, unspecified: Secondary | ICD-10-CM

## 2016-07-23 DIAGNOSIS — F17210 Nicotine dependence, cigarettes, uncomplicated: Secondary | ICD-10-CM | POA: Insufficient documentation

## 2016-07-23 DIAGNOSIS — Z7951 Long term (current) use of inhaled steroids: Secondary | ICD-10-CM | POA: Insufficient documentation

## 2016-07-23 DIAGNOSIS — J029 Acute pharyngitis, unspecified: Secondary | ICD-10-CM

## 2016-07-23 LAB — CBC
HEMATOCRIT: 46.5 % (ref 39.0–52.0)
Hemoglobin: 15.4 g/dL (ref 13.0–17.0)
MCH: 23.4 pg — ABNORMAL LOW (ref 26.0–34.0)
MCHC: 33.1 g/dL (ref 30.0–36.0)
MCV: 70.6 fL — ABNORMAL LOW (ref 78.0–100.0)
PLATELETS: 150 10*3/uL (ref 150–400)
RBC: 6.59 MIL/uL — AB (ref 4.22–5.81)
RDW: 14.9 % (ref 11.5–15.5)
WBC: 7.9 10*3/uL (ref 4.0–10.5)

## 2016-07-23 LAB — COMPREHENSIVE METABOLIC PANEL
ALT: 22 U/L (ref 17–63)
AST: 19 U/L (ref 15–41)
Albumin: 4.3 g/dL (ref 3.5–5.0)
Alkaline Phosphatase: 62 U/L (ref 38–126)
Anion gap: 8 (ref 5–15)
BILIRUBIN TOTAL: 0.6 mg/dL (ref 0.3–1.2)
BUN: 14 mg/dL (ref 6–20)
CO2: 28 mmol/L (ref 22–32)
CREATININE: 1.17 mg/dL (ref 0.61–1.24)
Calcium: 9.3 mg/dL (ref 8.9–10.3)
Chloride: 102 mmol/L (ref 101–111)
Glucose, Bld: 99 mg/dL (ref 65–99)
POTASSIUM: 3.4 mmol/L — AB (ref 3.5–5.1)
Sodium: 138 mmol/L (ref 135–145)
TOTAL PROTEIN: 7.1 g/dL (ref 6.5–8.1)

## 2016-07-23 LAB — LIPASE, BLOOD: Lipase: 19 U/L (ref 11–51)

## 2016-07-23 LAB — RAPID STREP SCREEN (MED CTR MEBANE ONLY): STREPTOCOCCUS, GROUP A SCREEN (DIRECT): NEGATIVE

## 2016-07-23 MED ORDER — SODIUM CHLORIDE 0.9 % IV SOLN: 1000.0000 mL | INTRAVENOUS | Status: DC

## 2016-07-23 MED ORDER — SODIUM CHLORIDE 0.9 % IV SOLN
1000.0000 mL | Freq: Once | INTRAVENOUS | Status: AC
  Administered 2016-07-23: 1000 mL via INTRAVENOUS

## 2016-07-23 MED ORDER — ONDANSETRON HCL 4 MG/2ML IJ SOLN
4.0000 mg | Freq: Once | INTRAMUSCULAR | Status: AC
  Administered 2016-07-23: 4 mg via INTRAVENOUS
  Filled 2016-07-23: qty 2

## 2016-07-23 NOTE — ED Provider Notes (Signed)
[facility] Provider Note   CSN: [id number] Arrival date & time: 07/23/16  2042   By signing my name below, I, Austin Church, attest that this documentation has been prepared under the direction and in the presence of Linwood DibblesJon Kanasia Gayman, MD. Electronically Signed: Valentino SaxonBianca Church, ED Scribe. 07/23/16. 12:48 AM.   History   Chief Complaint Chief Complaint  Patient presents with  . Sore Throat  . Emesis    The history is provided by the patient. No language interpreter was used.     HPI Comments: Austin Church is a 20 y.o. male who presents to the Emergency Department complaining of moderate multiple episodes of emesis onset today. Pt reports associated sore throat with cough, nausea and HA. He notes being able to keep down food and fluids. He denies fever. He does note having a Hx of asthma. No additional complaints at this time.    Surgery five months ago  Past Medical History:  Diagnosis Date  . Asthma    no routine meds used  . Nausea and vomiting    intermittent with abdominal pain  . Seasonal allergies   . Stricture, ureter 0ct 2016    Patient Active Problem List   Diagnosis Date Noted  . 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 CYSTOSCOPY W/ URETERAL STENT PLACEMENT Right 07/30/2015   Procedure: CYSTOSCOPY WITH RETROGRADE PYELOGRAM/URETERAL STENT PLACEMENT;  Surgeon: Sebastian Acheheodore Manny, MD;  Location: [facility];  Service: Urology;  Laterality: Right;  . FINGER SURGERY    . ROBOT ASSISTED PYELOPLASTY Right 07/30/2015   Procedure: ROBOTIC ASSISTED PYELOPLASTY WITH STENT PLACEMENT;  Surgeon: Sebastian Acheheodore Manny, MD;  Location: [facility];  Service: Urology;  Laterality: Right;       Home Medications    Prior to Admission medications   Medication Sig Start Date End Date Taking? Authorizing Provider  albuterol (PROVENTIL HFA;VENTOLIN HFA) 108 (90 Base) MCG/ACT inhaler Inhale 1-2 puffs into the lungs every 6  (six) hours as needed for wheezing or shortness of breath. 07/24/16   Linwood DibblesJon Kendan Cornforth, MD  amoxicillin-clavulanate (AUGMENTIN) 875-125 MG tablet Take 1 tablet by mouth 2 (two) times daily. 12/12/15   Marily MemosJason Mesner, MD  dexamethasone (DECADRON) 6 MG tablet Take 2 tablets (12 mg total) by mouth once as needed (sore throat). If sore throat returns/still not improved 12/15/15   Marily MemosJason Mesner, MD  dextromethorphan-guaiFENesin [facility](MUCINEX DM) 30-600 MG 12hr tablet Take 1 tablet by mouth 2 (two) times daily as needed for cough.    Historical Provider, MD  ondansetron (ZOFRAN ODT) 8 MG disintegrating tablet Take 1 tablet (8 mg total) by mouth every 8 (eight) hours as needed for nausea or vomiting. 07/24/16   Linwood DibblesJon Mulki Roesler, MD  ondansetron (ZOFRAN) 4 MG tablet Take 1 tablet (4 mg total) by mouth every 8 (eight) hours as needed for nausea or vomiting. 12/12/15   Marily MemosJason Mesner, MD  phenol (CHLORASEPTIC) 1.4 % LIQD Use as directed 1 spray in the mouth or throat every 6 (six) hours as needed for throat irritation / pain.    Historical Provider, MD    Family History Family History  Problem Relation Age of Onset  . Cancer Other     Social History Social History  Substance Use Topics  . Smoking status: Current Some Day Smoker    Packs/day: 0.25    Years: 10.00    Types: Cigarettes  . Smokeless tobacco: Never Used  . Alcohol use No  Allergies   Pineapple   Review of Systems Review of Systems  Constitutional: Negative for fever.  Respiratory: Positive for cough.   Gastrointestinal: Positive for nausea and vomiting.  Neurological: Positive for headaches.  All other systems reviewed and are negative.    Physical Exam Updated Vital Signs BP 129/74   Pulse 80   Temp 98.5 F (36.9 C) (Oral)   Resp 12   Ht 5\' 11"  (1.803 m)   Wt 81.6 kg   SpO2 100%   BMI 25.10 kg/m   Physical Exam  Constitutional: He appears well-developed and well-nourished. No distress.  HENT:  Head: Normocephalic and atraumatic.    Right Ear: External ear normal.  Left Ear: External ear normal.  Nose: Rhinorrhea present.  Mouth/Throat: Mucous membranes are normal. Posterior oropharyngeal erythema present. No oropharyngeal exudate or posterior oropharyngeal edema.  Eyes: Conjunctivae are normal. Right eye exhibits no discharge. Left eye exhibits no discharge. No scleral icterus.  Neck: Neck supple. No tracheal deviation present.  Cardiovascular: Normal rate, regular rhythm and intact distal pulses.   Pulmonary/Chest: Effort normal and breath sounds normal. No stridor. No respiratory distress. He has no wheezes. He has no rales.  Abdominal: Soft. Bowel sounds are normal. He exhibits no distension. There is no tenderness. There is no rebound and no guarding.  Musculoskeletal: He exhibits no edema or tenderness.  Neurological: He is alert. He has normal strength. No cranial nerve deficit (no facial droop, extraocular movements intact, no slurred speech) or sensory deficit. He exhibits normal muscle tone. He displays no seizure activity. Coordination normal.  Skin: Skin is warm and dry. No rash noted.  Psychiatric: He has a normal mood and affect.  Nursing note and vitals reviewed.    ED Treatments / Results   DIAGNOSTIC STUDIES: Oxygen Saturation is 100% on RA, normal by my interpretation.    COORDINATION OF CARE: 11:11 PM Discussed treatment plan with pt at bedside which includes labs and strep test and pt agreed to plan.   Labs (all labs ordered are listed, but only abnormal results are displayed) Labs Reviewed  COMPREHENSIVE METABOLIC PANEL - Abnormal; Notable for the following:       Result Value   Potassium 3.4 (*)    All other components within normal limits  CBC - Abnormal; Notable for the following:    RBC 6.59 (*)    MCV 70.6 (*)    MCH 23.4 (*)    All other components within normal limits  RAPID STREP SCREEN (NOT AT [facility])  CULTURE, GROUP A STREP ([facility])  LIPASE, BLOOD  URINALYSIS, ROUTINE W  REFLEX MICROSCOPIC (NOT AT [facility])    Procedures Procedures (including critical care time)  Medications Ordered in ED Medications  0.9 %  sodium chloride infusion (0 mLs Intravenous Stopped 07/23/16 2359)    Followed by  0.9 %  sodium chloride infusion (1,000 mLs Intravenous New Bag/Given 07/23/16 2332)    Followed by  0.9 %  sodium chloride infusion (not administered)  ondansetron (ZOFRAN) injection 4 mg (4 mg Intravenous Given 07/23/16 2330)     Initial Impression / Assessment and Plan / ED Course  I have reviewed the triage vital signs and the nursing notes.  Pertinent labs & imaging results that were available during my care of the patient were reviewed by me and considered in my medical decision making (see chart for details).  Clinical Course    Patient's symptoms are most likely related to a viral infection. Strep test is negative. Abdominal  exam is benign. Patient was hydrated with IV fluids. He was given a dose of Zofran. Patient states his symptoms had improved and was ready to go home.  A urine test was ordered by nursing standing order.  He was not able to provide a urine sample before he left.  I doubt uti.  Final Clinical Impressions(s) / ED Diagnoses   Final diagnoses:  Viral pharyngitis  Non-intractable vomiting with nausea, unspecified vomiting type    New Prescriptions New Prescriptions   ALBUTEROL (PROVENTIL HFA;VENTOLIN HFA) 108 (90 BASE) MCG/ACT INHALER    Inhale 1-2 puffs into the lungs every 6 (six) hours as needed for wheezing or shortness of breath.   ONDANSETRON (ZOFRAN ODT) 8 MG DISINTEGRATING TABLET    Take 1 tablet (8 mg total) by mouth every 8 (eight) hours as needed for nausea or vomiting.   I personally performed the services described in this documentation, which was scribed in my presence.  The recorded information has been reviewed and is accurate.     Linwood Dibbles, MD 07/24/16 [id number]

## 2016-07-23 NOTE — ED Triage Notes (Signed)
Sore throat. Vomiting. He thinks he has strep throat.

## 2016-07-24 MED ORDER — ALBUTEROL SULFATE HFA 108 (90 BASE) MCG/ACT IN AERS: 1.0000 | INHALATION_SPRAY | Freq: Four times a day (QID) | RESPIRATORY_TRACT | 0 refills | Status: AC | PRN

## 2016-07-24 MED ORDER — ONDANSETRON 8 MG PO TBDP: 8.0000 mg | ORAL_TABLET | Freq: Three times a day (TID) | ORAL | 0 refills | Status: DC | PRN

## 2016-07-24 NOTE — Discharge Instructions (Signed)
Take Tylenol or ibuprofen for fever and discomfort.Take Zofran to help for nausea. Follow-up with primary doctor next week if not better

## 2016-07-26 LAB — CULTURE, GROUP A STREP (THRC)

## 2016-10-07 ENCOUNTER — Encounter (HOSPITAL_COMMUNITY): Payer: Self-pay | Admitting: Emergency Medicine

## 2016-10-07 ENCOUNTER — Emergency Department (HOSPITAL_COMMUNITY)
Admission: EM | Admit: 2016-10-07 | Discharge: 2016-10-07 | Disposition: A | Discharge status: Home / Self Care | Payer: BLUE CROSS/BLUE SHIELD | Attending: Emergency Medicine | Admitting: Emergency Medicine

## 2016-10-07 ENCOUNTER — Emergency Department (HOSPITAL_COMMUNITY): Payer: BLUE CROSS/BLUE SHIELD

## 2016-10-07 DIAGNOSIS — G8929 Other chronic pain: Secondary | ICD-10-CM

## 2016-10-07 DIAGNOSIS — J45909 Unspecified asthma, uncomplicated: Secondary | ICD-10-CM | POA: Diagnosis not present

## 2016-10-07 DIAGNOSIS — Z79899 Other long term (current) drug therapy: Secondary | ICD-10-CM | POA: Diagnosis not present

## 2016-10-07 DIAGNOSIS — F17210 Nicotine dependence, cigarettes, uncomplicated: Secondary | ICD-10-CM | POA: Insufficient documentation

## 2016-10-07 DIAGNOSIS — M79645 Pain in left finger(s): Secondary | ICD-10-CM | POA: Diagnosis not present

## 2016-10-07 DIAGNOSIS — M25512 Pain in left shoulder: Secondary | ICD-10-CM | POA: Insufficient documentation
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
## 2016-10-07 NOTE — Discharge Instructions (Signed)
You are receiving a controlled substance, tramadol, from Dr. Ronne BinningMcKenzie your primary care physician.  Emergency Department will not provide additional controlled substances while you are under the care of a physician already providing them for you.  Call the physicians above is interested in having her finger, or shoulder further evaluated as an outpatient.  Follow-up with Dr. Berneice HeinrichManny regarding your renal stent.

## 2016-10-07 NOTE — ED Notes (Signed)
Bed: [id number] Expected date:  Expected time:  Means of arrival:  Comments: Neck pain
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
## 2017-01-20 ENCOUNTER — Ambulatory Visit (HOSPITAL_COMMUNITY)
Admission: EM | Admit: 2017-01-20 | Discharge: 2017-01-20 | Disposition: A | Discharge status: Home / Self Care | Payer: BLUE CROSS/BLUE SHIELD | Attending: Family Medicine | Admitting: Family Medicine

## 2017-01-20 ENCOUNTER — Encounter (HOSPITAL_COMMUNITY): Payer: Self-pay | Admitting: Emergency Medicine

## 2017-01-20 DIAGNOSIS — R109 Unspecified abdominal pain: Secondary | ICD-10-CM

## 2017-01-20 DIAGNOSIS — J111 Influenza due to unidentified influenza virus with other respiratory manifestations: Secondary | ICD-10-CM

## 2017-01-20 DIAGNOSIS — R509 Fever, unspecified: Secondary | ICD-10-CM | POA: Diagnosis not present

## 2017-01-20 DIAGNOSIS — R69 Illness, unspecified: Secondary | ICD-10-CM

## 2017-01-20 DIAGNOSIS — M5489 Other dorsalgia: Secondary | ICD-10-CM

## 2017-01-20 DIAGNOSIS — R1110 Vomiting, unspecified: Secondary | ICD-10-CM | POA: Diagnosis not present

## 2017-01-20 LAB — POCT URINALYSIS DIP (DEVICE)
Bilirubin Urine: NEGATIVE
GLUCOSE, UA: NEGATIVE mg/dL
Hgb urine dipstick: NEGATIVE
Ketones, ur: NEGATIVE mg/dL
Leukocytes, UA: NEGATIVE
Nitrite: NEGATIVE
PROTEIN: 100 mg/dL — AB
Specific Gravity, Urine: 1.02 (ref 1.005–1.030)
UROBILINOGEN UA: 0.2 mg/dL (ref 0.0–1.0)
pH: 6 (ref 5.0–8.0)

## 2017-01-20 MED ORDER — ONDANSETRON 4 MG PO TBDP
8.0000 mg | ORAL_TABLET | Freq: Once | ORAL | Status: AC
  Administered 2017-01-20: 8 mg via ORAL

## 2017-01-20 MED ORDER — OSELTAMIVIR PHOSPHATE 75 MG PO CAPS: 75.0000 mg | ORAL_CAPSULE | Freq: Two times a day (BID) | ORAL | 0 refills | Status: AC

## 2017-01-20 MED ORDER — ACETAMINOPHEN 325 MG PO TABS
ORAL_TABLET | ORAL | Status: AC
  Filled 2017-01-20: qty 2

## 2017-01-20 MED ORDER — ONDANSETRON 4 MG PO TBDP
ORAL_TABLET | ORAL | Status: AC
  Filled 2017-01-20: qty 2

## 2017-01-20 MED ORDER — ONDANSETRON 4 MG PO TBDP: 4.0000 mg | ORAL_TABLET | Freq: Three times a day (TID) | ORAL | 0 refills | Status: AC | PRN

## 2017-01-20 MED ORDER — ACETAMINOPHEN 325 MG PO TABS
650.0000 mg | ORAL_TABLET | Freq: Once | ORAL | Status: AC
  Administered 2017-01-20: 650 mg via ORAL

## 2017-01-20 NOTE — ED Triage Notes (Signed)
Pt c/o fever onset 1000 today associated w/vomiting, abd pain, back pain, HA  Sts when he coughs, HA gets worse  A&O x4... NAD

## 2017-01-20 NOTE — Discharge Instructions (Signed)
You most likely have the flu, or a flulike illness. I advise rest, plenty of fluids and management of symptoms with over the counter medicines. For symptoms you may take Tylenol as needed every 4-6 hours for body aches or fever, not to exceed 4,000 mg a day, Take mucinex or mucinex DM ever 12 hours with a full glass of water, you may use an inhaled steroid such as Flonase, 2 sprays each nostril once a day for congestion, or an antihistamine such as Claritin or Zyrtec once a day. For Nausea, I have prescribed Zofran, take 1 tablet under the tongue every 8 hours as needed. For treatment of influenza, I have prescribed Tamiflu. Take 1 tablet twice a day for 5 days. Should your symptoms worsen or fail to resolve, follow up with your primary care provider or return to clinic.

## 2017-01-20 NOTE — ED Provider Notes (Signed)
CSN: [id number]     Arrival date & time 01/20/17  1930 History   First MD Initiated Contact with Patient 01/20/17 2003     Chief Complaint  Patient presents with  . Fever   (Consider location/radiation/quality/duration/timing/severity/associated sxs/prior Treatment) The history is provided by the patient.  Fever  Max temp prior to arrival:  101 Temp source:  Oral Severity:  Moderate Onset quality:  Sudden Duration:  1 day Timing:  Constant Progression:  Worsening Chronicity:  New Relieved by:  None tried Worsened by:  Nothing Ineffective treatments:  None tried Associated symptoms: chills, congestion, cough, headaches, myalgias, nausea, rhinorrhea and vomiting   Associated symptoms: no chest pain, no diarrhea, no dysuria, no rash and no sore throat   Myalgias:    Location:  Generalized   Quality:  Aching   Severity:  Moderate   Onset quality:  Gradual   Timing:  Constant   Progression:  Worsening Rhinorrhea:    Quality:  Clear   Severity:  Mild   Duration:  1 day   Timing:  Constant Vomiting:    Quality:  Stomach contents   Number of occurrences:  4-5   Severity:  Mild   Duration:  1 day   Timing:  Constant   Progression:  Worsening Risk factors: sick contacts   Risk factors: no contaminated food and no recent travel     Past Medical History:  Diagnosis Date  . Asthma    no routine meds used  . Nausea and vomiting    intermittent with abdominal pain  . Seasonal allergies   . Stricture, ureter 0ct 2016   Past Surgical History:  Procedure Laterality Date  . CYSTOSCOPY W/ URETERAL STENT PLACEMENT Right 07/30/2015   Procedure: CYSTOSCOPY WITH RETROGRADE PYELOGRAM/URETERAL STENT PLACEMENT;  Surgeon: Sebastian Ache, MD;  Location: [facility];  Service: Urology;  Laterality: Right;  . FINGER SURGERY    . ROBOT ASSISTED PYELOPLASTY Right 07/30/2015   Procedure: ROBOTIC ASSISTED PYELOPLASTY WITH STENT PLACEMENT;  Surgeon: Sebastian Ache, MD;  Location: [facility];   Service: Urology;  Laterality: Right;   Family History  Problem Relation Age of Onset  . Cancer Other    Social History  Substance Use Topics  . Smoking status: Current Some Day Smoker    Packs/day: 0.25    Years: 10.00    Types: Cigarettes  . Smokeless tobacco: Never Used  . Alcohol use No    Review of Systems  Constitutional: Positive for appetite change, chills, fatigue and fever.  HENT: Positive for congestion and rhinorrhea. Negative for sinus pain, sinus pressure and sore throat.   Eyes: Negative.   Respiratory: Positive for cough. Negative for shortness of breath.   Cardiovascular: Negative for chest pain and palpitations.  Gastrointestinal: Positive for abdominal pain, nausea and vomiting. Negative for diarrhea.  Genitourinary: Negative for dysuria and frequency.  Musculoskeletal: Positive for myalgias. Negative for neck pain and neck stiffness.  Skin: Negative for color change and rash.  Neurological: Positive for headaches. Negative for light-headedness.  All other systems reviewed and are negative.   Allergies  Pineapple  Home Medications   Prior to Admission medications   Medication Sig Start Date End Date Taking? Authorizing Provider  albuterol (PROVENTIL HFA;VENTOLIN HFA) 108 (90 Base) MCG/ACT inhaler Inhale 1-2 puffs into the lungs every 6 (six) hours as needed for wheezing or shortness of breath. 07/24/16   Linwood Dibbles, MD  amoxicillin-clavulanate (AUGMENTIN) 875-125 MG tablet Take 1 tablet by mouth 2 (two) times daily.  12/12/15   Marily Memos, MD  dexamethasone (DECADRON) 6 MG tablet Take 2 tablets (12 mg total) by mouth once as needed (sore throat). If sore throat returns/still not improved 12/15/15   Marily Memos, MD  dextromethorphan-guaiFENesin [facility] DM) 30-600 MG 12hr tablet Take 1 tablet by mouth 2 (two) times daily as needed for cough.    Historical Provider, MD  ondansetron (ZOFRAN ODT) 4 MG disintegrating tablet Take 1 tablet (4 mg total) by mouth  every 8 (eight) hours as needed for nausea or vomiting. 01/20/17   Dorena Bodo, NP  ondansetron (ZOFRAN) 4 MG tablet Take 1 tablet (4 mg total) by mouth every 8 (eight) hours as needed for nausea or vomiting. 12/12/15   Marily Memos, MD  oseltamivir (TAMIFLU) 75 MG capsule Take 1 capsule (75 mg total) by mouth every 12 (twelve) hours. 01/20/17   Dorena Bodo, NP  phenol (CHLORASEPTIC) 1.4 % LIQD Use as directed 1 spray in the mouth or throat every 6 (six) hours as needed for throat irritation / pain.    Historical Provider, MD   Meds Ordered and Administered this Visit   Medications  acetaminophen (TYLENOL) tablet 650 mg (650 mg Oral Given 01/20/17 2008)  ondansetron (ZOFRAN-ODT) disintegrating tablet 8 mg (8 mg Oral Given 01/20/17 2011)    BP 130/79 (BP Location: Left Arm)   Pulse (!) 113   Temp (!) 102.7 F (39.3 C) (Oral)   Resp 20   SpO2 98%  No data found.   Physical Exam  Constitutional: He is oriented to person, place, and time. He appears well-developed and well-nourished. No distress.  HENT:  Head: Normocephalic and atraumatic.  Right Ear: Tympanic membrane and external ear normal.  Left Ear: Tympanic membrane and external ear normal.  Nose: Nose normal. Right sinus exhibits no maxillary sinus tenderness and no frontal sinus tenderness. Left sinus exhibits no maxillary sinus tenderness and no frontal sinus tenderness.  Mouth/Throat: Uvula is midline and oropharynx is clear and moist. No oropharyngeal exudate.  Eyes: Conjunctivae are normal. Right eye exhibits no discharge. Left eye exhibits no discharge.  Neck: Normal range of motion. Neck supple. No JVD present.  Cardiovascular: Normal rate and regular rhythm.   Pulmonary/Chest: Effort normal and breath sounds normal. No respiratory distress. He has no wheezes.  Abdominal: Soft. Bowel sounds are normal. There is no hepatosplenomegaly. There is generalized tenderness.  Lymphadenopathy:    He has no cervical adenopathy.   Neurological: He is alert and oriented to person, place, and time.  Skin: Skin is warm and dry. Capillary refill takes less than 2 seconds. No rash noted. He is not diaphoretic. No erythema.  Psychiatric: He has a normal mood and affect. His behavior is normal.  Nursing note and vitals reviewed.   Urgent Care Course     Procedures (including critical care time)  Labs Review Labs Reviewed  POCT URINALYSIS DIP (DEVICE) - Abnormal; Notable for the following:       Result Value   Protein, ur 100 (*)    All other components within normal limits    Imaging Review No results found.    MDM   1. Influenza-like illness     Treating for flulike illness, patient started on Tamiflu, Zofran, provided with a work note, counseling was provided on over-the-counter medicine for symptom management. Recommend clear liquid diet for next 24 hours, slowly introduce solid foods after that. Follow-up with primary care if symptoms persist past one week, go to the emergency room at any  time symptoms worsen.     Dorena Bodo, NP 01/20/17 2037

## 2017-02-25 ENCOUNTER — Emergency Department (HOSPITAL_COMMUNITY): Payer: Medicaid Other

## 2017-02-25 ENCOUNTER — Encounter (HOSPITAL_COMMUNITY): Payer: Self-pay | Admitting: Emergency Medicine

## 2017-02-25 ENCOUNTER — Emergency Department (HOSPITAL_COMMUNITY)
Admission: EM | Admit: 2017-02-25 | Discharge: 2017-02-25 | Disposition: A | Discharge status: Home / Self Care | Payer: Medicaid Other | Attending: Emergency Medicine | Admitting: Emergency Medicine

## 2017-02-25 DIAGNOSIS — Y9367 Activity, basketball: Secondary | ICD-10-CM | POA: Insufficient documentation

## 2017-02-25 DIAGNOSIS — Y929 Unspecified place or not applicable: Secondary | ICD-10-CM | POA: Insufficient documentation

## 2017-02-25 DIAGNOSIS — X58XXXA Exposure to other specified factors, initial encounter: Secondary | ICD-10-CM | POA: Insufficient documentation

## 2017-02-25 DIAGNOSIS — F17210 Nicotine dependence, cigarettes, uncomplicated: Secondary | ICD-10-CM | POA: Diagnosis not present

## 2017-02-25 DIAGNOSIS — S93492A Sprain of other ligament of left ankle, initial encounter: Secondary | ICD-10-CM | POA: Diagnosis not present

## 2017-02-25 DIAGNOSIS — S99912A Unspecified injury of left ankle, initial encounter: Secondary | ICD-10-CM | POA: Diagnosis present

## 2017-02-25 DIAGNOSIS — J45909 Unspecified asthma, uncomplicated: Secondary | ICD-10-CM | POA: Insufficient documentation

## 2017-02-25 DIAGNOSIS — Y999 Unspecified external cause status: Secondary | ICD-10-CM | POA: Insufficient documentation

## 2017-02-25 MED ORDER — TRAMADOL HCL 50 MG PO TABS: 50.0000 mg | ORAL_TABLET | Freq: Four times a day (QID) | ORAL | 0 refills | Status: AC | PRN

## 2017-02-25 MED ORDER — IBUPROFEN 800 MG PO TABS: 800.0000 mg | ORAL_TABLET | Freq: Three times a day (TID) | ORAL | 0 refills | Status: AC | PRN

## 2017-02-25 NOTE — Discharge Instructions (Signed)
Return here as needed.  Follow-up with the orthopedist provided. You x-rays did not show any broken bones. Ice and elevate the ankle.
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
## 2017-03-03 NOTE — ED Provider Notes (Signed)
[facility]-EMERGENCY DEPT Provider Note   CSN: [id number] Arrival date & time: 02/25/17  0550     History   Chief Complaint Chief Complaint  Patient presents with  . Ankle Injury    HPI Austin Church is a 21 y.o. male.  HPI Patient presents to the emergency department with right ankle injury following a basketball game.  The patient states he twisted his ankle in the game.  He states he feels like he broke his ankle cigarettes overriding to the emergency department.  The patient states that he did not take any medications prior to arrival.  Patient denies numbness, weakness, fall, headache, blurred vision, or syncope Past Medical History:  Diagnosis Date  . Asthma    no routine meds used  . Nausea and vomiting    intermittent with abdominal pain  . Seasonal allergies   . Stricture, ureter 0ct 2016    Patient Active Problem List   Diagnosis Date Noted  . 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 CYSTOSCOPY W/ URETERAL STENT PLACEMENT Right 07/30/2015   Procedure: CYSTOSCOPY WITH RETROGRADE PYELOGRAM/URETERAL STENT PLACEMENT;  Surgeon: Sebastian Acheheodore Manny, MD;  Location: [facility];  Service: Urology;  Laterality: Right;  . FINGER SURGERY    . ROBOT ASSISTED PYELOPLASTY Right 07/30/2015   Procedure: ROBOTIC ASSISTED PYELOPLASTY WITH STENT PLACEMENT;  Surgeon: Sebastian Acheheodore Manny, MD;  Location: [facility];  Service: Urology;  Laterality: Right;       Home Medications    Prior to Admission medications   Medication Sig Start Date End Date Taking? Authorizing Provider  albuterol (PROVENTIL HFA;VENTOLIN HFA) 108 (90 Base) MCG/ACT inhaler Inhale 1-2 puffs into the lungs every 6 (six) hours as needed for wheezing or shortness of breath. 07/24/16   Linwood DibblesKnapp, Jon, MD  amoxicillin-clavulanate (AUGMENTIN) 875-125 MG tablet Take 1 tablet by mouth 2 (two) times daily. 12/12/15   Mesner, Barbara CowerJason, MD  dexamethasone (DECADRON) 6 MG tablet Take 2 tablets  (12 mg total) by mouth once as needed (sore throat). If sore throat returns/still not improved 12/15/15   Mesner, Barbara CowerJason, MD  dextromethorphan-guaiFENesin [facility](MUCINEX DM) 30-600 MG 12hr tablet Take 1 tablet by mouth 2 (two) times daily as needed for cough.    [provider]  ibuprofen (ADVIL,MOTRIN) 800 MG tablet Take 1 tablet (800 mg total) by mouth every 8 (eight) hours as needed. 02/25/17   Aesha Agrawal, Cristal Deerhristopher, PA-C  ondansetron (ZOFRAN ODT) 4 MG disintegrating tablet Take 1 tablet (4 mg total) by mouth every 8 (eight) hours as needed for nausea or vomiting. 01/20/17   Dorena BodoKennard, Lawrence, NP  ondansetron (ZOFRAN) 4 MG tablet Take 1 tablet (4 mg total) by mouth every 8 (eight) hours as needed for nausea or vomiting. 12/12/15   Mesner, Barbara CowerJason, MD  oseltamivir (TAMIFLU) 75 MG capsule Take 1 capsule (75 mg total) by mouth every 12 (twelve) hours. 01/20/17   Dorena BodoKennard, Lawrence, NP  phenol (CHLORASEPTIC) 1.4 % LIQD Use as directed 1 spray in the mouth or throat every 6 (six) hours as needed for throat irritation / pain.    [provider]  traMADol (ULTRAM) 50 MG tablet Take 1 tablet (50 mg total) by mouth every 6 (six) hours as needed for severe pain. 02/25/17   Charlestine NightLawyer, Sheena Simonis, PA-C    Family History Family History  Problem Relation Age of Onset  . Cancer Other     Social History Social History  Substance Use Topics  . Smoking status: Current Some  Day Smoker    Packs/day: 0.25    Years: 10.00    Types: Cigarettes  . Smokeless tobacco: Never Used  . Alcohol use No     Allergies   Pineapple   Review of Systems Review of Systems All other systems negative except as documented in the HPI. All pertinent positives and negatives as reviewed in the HPI.  Physical Exam Updated Vital Signs BP 114/68 (BP Location: Left Arm)   Pulse 60   Temp 97.8 F (36.6 C)   Resp 17   Ht 6' (1.829 m)   Wt 74.8 kg (165 lb)   SpO2 99%   BMI 22.38 kg/m   Physical Exam  Constitutional:  He is oriented to person, place, and time. He appears well-developed and well-nourished. No distress.  HENT:  Head: Normocephalic and atraumatic.  Eyes: Pupils are equal, round, and reactive to light.  Pulmonary/Chest: Effort normal.  Musculoskeletal:       Left ankle: He exhibits decreased range of motion and swelling. He exhibits no ecchymosis, no deformity and normal pulse. Tenderness. Arzell tendon normal.  Neurological: He is alert and oriented to person, place, and time.  Skin: Skin is warm and dry.  Psychiatric: He has a normal mood and affect.  Nursing note and vitals reviewed.    ED Treatments / Results  Labs (all labs ordered are listed, but only abnormal results are displayed) Labs Reviewed - No data to display  EKG  EKG Interpretation None       Radiology No results found.  Procedures Procedures (including critical care time)  Medications Ordered in ED Medications - No data to display   Initial Impression / Assessment and Plan / ED Course  I have reviewed the triage vital signs and the nursing notes.  Pertinent labs & imaging results that were available during my care of the patient were reviewed by me and considered in my medical decision making (see chart for details).     Patient be placed in an ASO.  Crutches.  Told to follow up with the orthopedist provided.  Return here as needed.  Ice and elevate the ankle  Final Clinical Impressions(s) / ED Diagnoses   Final diagnoses:  Sprain of other ligament of left ankle, initial encounter    New Prescriptions Discharge Medication List as of 02/25/2017  7:40 AM    START taking these medications   Details  ibuprofen (ADVIL,MOTRIN) 800 MG tablet Take 1 tablet (800 mg total) by mouth every 8 (eight) hours as needed., Starting Fri 02/25/2017, Print    traMADol (ULTRAM) 50 MG tablet Take 1 tablet (50 mg total) by mouth every 6 (six) hours as needed for severe pain., Starting Fri 02/25/2017, Print           Jacalynn Buzzell, [place], PA-C 03/03/17 1645    Arby Barrette, MD 03/09/17 [phone]

## 2018-07-17 ENCOUNTER — Other Ambulatory Visit: Payer: Self-pay

## 2018-07-17 ENCOUNTER — Emergency Department (HOSPITAL_COMMUNITY)
Admission: EM | Admit: 2018-07-17 | Discharge: 2018-07-17 | Disposition: A | Discharge status: Home / Self Care | Payer: Self-pay | Attending: Emergency Medicine | Admitting: Emergency Medicine

## 2018-07-17 ENCOUNTER — Encounter (HOSPITAL_COMMUNITY): Payer: Self-pay | Admitting: Emergency Medicine

## 2018-07-17 DIAGNOSIS — N390 Urinary tract infection, site not specified: Secondary | ICD-10-CM | POA: Insufficient documentation

## 2018-07-17 DIAGNOSIS — R369 Urethral discharge, unspecified: Secondary | ICD-10-CM | POA: Insufficient documentation

## 2018-07-17 DIAGNOSIS — R300 Dysuria: Secondary | ICD-10-CM | POA: Insufficient documentation

## 2018-07-17 DIAGNOSIS — J45909 Unspecified asthma, uncomplicated: Secondary | ICD-10-CM | POA: Insufficient documentation

## 2018-07-17 DIAGNOSIS — F17210 Nicotine dependence, cigarettes, uncomplicated: Secondary | ICD-10-CM | POA: Insufficient documentation

## 2018-07-17 DIAGNOSIS — R319 Hematuria, unspecified: Secondary | ICD-10-CM | POA: Insufficient documentation

## 2018-07-17 LAB — WET PREP, GENITAL
CLUE CELLS WET PREP: NONE SEEN
Sperm: NONE SEEN
Trich, Wet Prep: NONE SEEN
Yeast Wet Prep HPF POC: NONE SEEN

## 2018-07-17 LAB — URINALYSIS, ROUTINE W REFLEX MICROSCOPIC
Bilirubin Urine: NEGATIVE
GLUCOSE, UA: NEGATIVE mg/dL
Ketones, ur: NEGATIVE mg/dL
Nitrite: NEGATIVE
PH: 5 (ref 5.0–8.0)
PROTEIN: 30 mg/dL — AB
SPECIFIC GRAVITY, URINE: 1.017 (ref 1.005–1.030)
WBC, UA: >50 WBC/hpf — ABNORMAL HIGH (ref 0–5)

## 2018-07-17 MED ORDER — CEFTRIAXONE SODIUM 250 MG IJ SOLR
250.0000 mg | Freq: Once | INTRAMUSCULAR | Status: AC
  Administered 2018-07-17: 250 mg via INTRAMUSCULAR
  Filled 2018-07-17: qty 250

## 2018-07-17 MED ORDER — AZITHROMYCIN 250 MG PO TABS
1000.0000 mg | ORAL_TABLET | Freq: Once | ORAL | Status: AC
  Administered 2018-07-17: 1000 mg via ORAL
  Filled 2018-07-17: qty 4

## 2018-07-17 MED ORDER — CEPHALEXIN 500 MG PO CAPS: 500.0000 mg | ORAL_CAPSULE | Freq: Two times a day (BID) | ORAL | 0 refills | Status: AC

## 2018-07-17 NOTE — Discharge Instructions (Addendum)
You have been treated today for gonorrhea and chlamydia.  Since you were treated for gonorrhea and chlamydia, I advise that you tell your sexual partners about this. Also I recommend that you abstain from sex for the next 7 days as well. ° °HIV and syphilis tests take approx. 24-48 hours to result. You will receive a call from us if either of these are positive. ° °Your urine also shows that you have an urinary tract infection. I have sent in a prescription for antibiotics that you will need to take. Please take this medication for the next five (5) days to ensure the infection is completely treated. ° °Please be safe. Thank you for allowing us to take care of you today. °

## 2018-07-17 NOTE — ED Triage Notes (Signed)
Pt states penile discharge for several days that is whitish brown. No swelling to groin.

## 2018-07-17 NOTE — ED Provider Notes (Addendum)
[facility] EMERGENCY DEPARTMENT Provider Note  CSN: [id number] Arrival date & time: 07/17/18  1032  History   Chief Complaint Chief Complaint  Patient presents with  . Penile Discharge    HPI Austin Church is a 22 y.o. male with a medical history of asthma and congenital ureter issues (s/p stent placement) who presented to the ED for penile discharge x 5 days. Describes discharge as white-brown in appearance. Also endorses dysuria. Denies fever, chills, abdominal pain, lymphadenopathy, arthralgias, skin rashes, urinary urgency, frequency, penile/testicular/scrotal pain. Patient endorses unprotected sexual intercourse with females with last activity occurring 2 weeks ago. Patient has had no intervention prior to coming to the ED today.  Past Medical History:  Diagnosis Date  . Asthma    no routine meds used  . Nausea and vomiting    intermittent with abdominal pain  . Seasonal allergies   . Stricture, ureter 0ct 2016    Patient Active Problem List   Diagnosis Date Noted  . 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 CYSTOSCOPY W/ URETERAL STENT PLACEMENT Right 07/30/2015   Procedure: CYSTOSCOPY WITH RETROGRADE PYELOGRAM/URETERAL STENT PLACEMENT;  Surgeon: Sebastian Ache, MD;  Location: [facility];  Service: Urology;  Laterality: Right;  . FINGER SURGERY    . ROBOT ASSISTED PYELOPLASTY Right 07/30/2015   Procedure: ROBOTIC ASSISTED PYELOPLASTY WITH STENT PLACEMENT;  Surgeon: Sebastian Ache, MD;  Location: [facility];  Service: Urology;  Laterality: Right;        Home Medications    Prior to Admission medications   Medication Sig Start Date End Date Taking? Authorizing Provider  albuterol (PROVENTIL HFA;VENTOLIN HFA) 108 (90 Base) MCG/ACT inhaler Inhale 1-2 puffs into the lungs every 6 (six) hours as needed for wheezing or shortness of breath. 07/24/16   Linwood Dibbles, MD  amoxicillin-clavulanate (AUGMENTIN)  875-125 MG tablet Take 1 tablet by mouth 2 (two) times daily. 12/12/15   Mesner, Barbara Cower, MD  cephALEXin (KEFLEX) 500 MG capsule Take 1 capsule (500 mg total) by mouth 2 (two) times daily for 5 days. 07/17/18 07/22/18  Ebubechukwu Jedlicka, Jerrel Ivory I, PA-C  dexamethasone (DECADRON) 6 MG tablet Take 2 tablets (12 mg total) by mouth once as needed (sore throat). If sore throat returns/still not improved 12/15/15   Mesner, Barbara Cower, MD  dextromethorphan-guaiFENesin [facility] DM) 30-600 MG 12hr tablet Take 1 tablet by mouth 2 (two) times daily as needed for cough.    [provider]  ibuprofen (ADVIL,MOTRIN) 800 MG tablet Take 1 tablet (800 mg total) by mouth every 8 (eight) hours as needed. 02/25/17   Lawyer, Cristal Deer, PA-C  ondansetron (ZOFRAN ODT) 4 MG disintegrating tablet Take 1 tablet (4 mg total) by mouth every 8 (eight) hours as needed for nausea or vomiting. 01/20/17   Dorena Bodo, NP  ondansetron (ZOFRAN) 4 MG tablet Take 1 tablet (4 mg total) by mouth every 8 (eight) hours as needed for nausea or vomiting. 12/12/15   Mesner, Barbara Cower, MD  oseltamivir (TAMIFLU) 75 MG capsule Take 1 capsule (75 mg total) by mouth every 12 (twelve) hours. 01/20/17   Dorena Bodo, NP  phenol (CHLORASEPTIC) 1.4 % LIQD Use as directed 1 spray in the mouth or throat every 6 (six) hours as needed for throat irritation / pain.    [provider]  traMADol (ULTRAM) 50 MG tablet Take 1 tablet (50 mg total) by mouth every 6 (six) hours as needed for severe pain. 02/25/17   Charlestine Night, PA-C  Family History Family History  Problem Relation Age of Onset  . Cancer Other     Social History Social History   Tobacco Use  . Smoking status: Current Some Day Smoker    Packs/day: 0.25    Years: 10.00    Pack years: 2.50    Types: Cigarettes  . Smokeless tobacco: Never Used  Substance Use Topics  . Alcohol use: No  . Drug use: No    Types: Marijuana    Comment: advised     Allergies    Pineapple   Review of Systems Review of Systems  Constitutional: Negative for chills and fever.  Gastrointestinal: Negative.   Genitourinary: Positive for dysuria and penile pain. Negative for discharge, frequency, hematuria, penile swelling, scrotal swelling, testicular pain and urgency.  Musculoskeletal: Negative.   Skin: Negative.   Hematological: Negative for adenopathy.     Physical Exam Updated Vital Signs BP (!) 151/99 (BP Location: Right Arm)   Pulse (!) 105   Temp 99.2 F (37.3 C) (Oral)   Resp 16   SpO2 100%   Physical Exam  Constitutional: He appears well-developed and well-nourished. He is cooperative.  Abdominal: Soft. Normal appearance and bowel sounds are normal. There is no tenderness.  Genitourinary: Testes normal. Right testis shows no swelling and no tenderness. Left testis shows no swelling and no tenderness. Circumcised. No penile erythema or penile tenderness. Discharge found.  Genitourinary Comments: White-yellow discharge coming from penis. No blood seen.  Musculoskeletal: Normal range of motion.  Lymphadenopathy: No inguinal adenopathy noted on the right or left side.  Neurological: He is alert.  Skin: Skin is warm and intact. Capillary refill takes less than 2 seconds. No rash noted.  Nursing note and vitals reviewed.  ED Treatments / Results  Labs (all labs ordered are listed, but only abnormal results are displayed) Labs Reviewed  WET PREP, GENITAL - Abnormal; Notable for the following components:      Result Value   WBC, Wet Prep HPF POC MANY (*)    All other components within normal limits  URINALYSIS, ROUTINE W REFLEX MICROSCOPIC - Abnormal; Notable for the following components:   APPearance CLOUDY (*)    Hgb urine dipstick MODERATE (*)    Protein, ur 30 (*)    Leukocytes, UA LARGE (*)    WBC, UA >50 (*)    Bacteria, UA RARE (*)    All other components within normal limits  RPR  HIV ANTIBODY (ROUTINE TESTING W REFLEX)  GC/CHLAMYDIA  PROBE AMP ([place]) NOT AT [facility]    EKG None  Radiology No results found.  Procedures Procedures (including critical care time)  Medications Ordered in ED Medications  cefTRIAXone (ROCEPHIN) injection 250 mg (250 mg Intramuscular Given 07/17/18 1258)  azithromycin (ZITHROMAX) tablet 1,000 mg (1,000 mg Oral Given 07/17/18 1258)     Initial Impression / Assessment and Plan / ED Course  Triage vital signs and the nursing notes have been reviewed.  Pertinent labs & imaging results that were available during care of the patient were reviewed and considered in medical decision making (see chart for details).  Patient presents afebrile with penile discharge and dysuria. He has no other complaints or constitutional s/s that raise concern for a systemic or intra-abdominal etiology to complaints. He is sexually active. Physical exam significant for white discharge from penis, but remaining exam negative. He will receive treatment for gonorrhea and chlamydia which is the most likely etiology to complaints given his demographics. Will also run UA  and test for syphilis and HIV as well.  Clinical Course as of Jul 17 1318  Mon Jul 17, 2018  1316 UA suggestive of UTI.   [GM]    Clinical Course User Index [GM] Windy Carina, [place]    Final Clinical Impressions(s) / ED Diagnoses  1. Penile Discharge. Received Rocephin and Zithromax for gonorrhea and chlamydia. HIV and syphilis pending. Education on STIs and safe sex practices given. 2. UTI. Rx for Keflex x5 days prescribed.  Dispo: Home. After thorough clinical evaluation, this patient is determined to be medically stable and can be safely discharged with the previously mentioned treatment and/or outpatient follow-up/referral(s). At this time, there are no other apparent medical conditions that require further screening, evaluation or treatment.  Final diagnoses:  Penile discharge  Urinary tract infection with hematuria, site  unspecified    ED Discharge Orders         Ordered    cephALEXin (KEFLEX) 500 MG capsule  2 times daily     07/17/18 1319            Kiyona Mcnall, [place] I, PA-C 07/17/18 1327    Olaf Mesa, [place] I, PA-C 07/17/18 1346    Sabas Sous, MD 07/17/18 [phone]

## 2018-07-18 LAB — HIV ANTIBODY (ROUTINE TESTING W REFLEX): HIV Screen 4th Generation wRfx: NONREACTIVE

## 2018-07-18 LAB — GC/CHLAMYDIA PROBE AMP (~~LOC~~) NOT AT ARMC
CHLAMYDIA, DNA PROBE: NEGATIVE
NEISSERIA GONORRHEA: POSITIVE — AB

## 2018-07-18 LAB — RPR: RPR Ser Ql: NONREACTIVE

## 2018-07-20 ENCOUNTER — Encounter (HOSPITAL_COMMUNITY): Payer: Self-pay | Admitting: Emergency Medicine

## 2018-07-20 ENCOUNTER — Emergency Department (HOSPITAL_COMMUNITY): Payer: Self-pay

## 2018-07-20 ENCOUNTER — Emergency Department (HOSPITAL_COMMUNITY)
Admission: EM | Admit: 2018-07-20 | Discharge: 2018-07-20 | Disposition: A | Discharge status: Home / Self Care | Payer: Self-pay | Attending: Emergency Medicine | Admitting: Emergency Medicine

## 2018-07-20 DIAGNOSIS — F17210 Nicotine dependence, cigarettes, uncomplicated: Secondary | ICD-10-CM | POA: Insufficient documentation

## 2018-07-20 DIAGNOSIS — R109 Unspecified abdominal pain: Secondary | ICD-10-CM | POA: Insufficient documentation

## 2018-07-20 DIAGNOSIS — Z79899 Other long term (current) drug therapy: Secondary | ICD-10-CM | POA: Insufficient documentation

## 2018-07-20 DIAGNOSIS — J45909 Unspecified asthma, uncomplicated: Secondary | ICD-10-CM | POA: Insufficient documentation

## 2018-07-20 LAB — CBC WITH DIFFERENTIAL/PLATELET
ABS IMMATURE GRANULOCYTES: 0.08 10*3/uL — AB (ref 0.00–0.07)
BASOS PCT: 0 %
Basophils Absolute: 0 10*3/uL (ref 0.0–0.1)
Eosinophils Absolute: 0.3 10*3/uL (ref 0.0–0.5)
Eosinophils Relative: 2 %
HCT: 50.2 % (ref 39.0–52.0)
Hemoglobin: 14.7 g/dL (ref 13.0–17.0)
Immature Granulocytes: 1 %
LYMPHS ABS: 3.5 10*3/uL (ref 0.7–4.0)
Lymphocytes Relative: 27 %
MCH: 22.1 pg — ABNORMAL LOW (ref 26.0–34.0)
MCHC: 29.3 g/dL — AB (ref 30.0–36.0)
MCV: 75.6 fL — ABNORMAL LOW (ref 80.0–100.0)
MONO ABS: 0.7 10*3/uL (ref 0.1–1.0)
MONOS PCT: 6 %
NEUTROS ABS: 8.5 10*3/uL — AB (ref 1.7–7.7)
Neutrophils Relative %: 64 %
Platelets: 180 10*3/uL (ref 150–400)
RBC: 6.64 MIL/uL — ABNORMAL HIGH (ref 4.22–5.81)
RDW: 15.9 % — AB (ref 11.5–15.5)
WBC: 13.1 10*3/uL — ABNORMAL HIGH (ref 4.0–10.5)
nRBC: 0 % (ref 0.0–0.2)

## 2018-07-20 LAB — COMPREHENSIVE METABOLIC PANEL
ALBUMIN: 3.8 g/dL (ref 3.5–5.0)
ALK PHOS: 70 U/L (ref 38–126)
ALT: 43 U/L (ref 0–44)
AST: 156 U/L — AB (ref 15–41)
Anion gap: 12 (ref 5–15)
BUN: 23 mg/dL — AB (ref 6–20)
CALCIUM: 9.3 mg/dL (ref 8.9–10.3)
CO2: 22 mmol/L (ref 22–32)
CREATININE: 1.94 mg/dL — AB (ref 0.61–1.24)
Chloride: 107 mmol/L (ref 98–111)
GFR, EST AFRICAN AMERICAN: 55 mL/min — AB (ref >60)
GFR, EST NON AFRICAN AMERICAN: 47 mL/min — AB (ref >60)
Glucose, Bld: 78 mg/dL (ref 70–99)
Potassium: 4 mmol/L (ref 3.5–5.1)
SODIUM: 141 mmol/L (ref 135–145)
Total Bilirubin: 0.6 mg/dL (ref 0.3–1.2)
Total Protein: 7 g/dL (ref 6.5–8.1)

## 2018-07-20 LAB — URINALYSIS, ROUTINE W REFLEX MICROSCOPIC
Bacteria, UA: NONE SEEN
Bilirubin Urine: NEGATIVE
GLUCOSE, UA: NEGATIVE mg/dL
Ketones, ur: NEGATIVE mg/dL
Leukocytes, UA: NEGATIVE
Nitrite: NEGATIVE
PROTEIN: 30 mg/dL — AB
SPECIFIC GRAVITY, URINE: 1.018 (ref 1.005–1.030)
pH: 5 (ref 5.0–8.0)

## 2018-07-20 LAB — I-STAT CG4 LACTIC ACID, ED: LACTIC ACID, VENOUS: 0.43 mmol/L — AB (ref 0.5–1.9)

## 2018-07-20 LAB — LIPASE, BLOOD: LIPASE: 27 U/L (ref 11–51)

## 2018-07-20 MED ORDER — SODIUM CHLORIDE 0.9 % IV BOLUS
1000.0000 mL | Freq: Once | INTRAVENOUS | Status: AC
  Administered 2018-07-20: 1000 mL via INTRAVENOUS

## 2018-07-20 MED ORDER — SODIUM CHLORIDE 0.9 % IV SOLN
1.0000 g | Freq: Once | INTRAVENOUS | Status: AC
  Administered 2018-07-20: 1 g via INTRAVENOUS
  Filled 2018-07-20: qty 10

## 2018-07-20 MED ORDER — METHOCARBAMOL 1000 MG/10ML IJ SOLN: 1000.0000 mg | Freq: Once | INTRAMUSCULAR | Status: DC

## 2018-07-20 MED ORDER — KETOROLAC TROMETHAMINE 15 MG/ML IJ SOLN
INTRAMUSCULAR | Status: AC
  Filled 2018-07-20: qty 1

## 2018-07-20 MED ORDER — HYDROMORPHONE HCL 1 MG/ML IJ SOLN: 1.0000 mg | Freq: Once | INTRAMUSCULAR | Status: DC

## 2018-07-20 MED ORDER — KETOROLAC TROMETHAMINE 15 MG/ML IJ SOLN
15.0000 mg | Freq: Once | INTRAMUSCULAR | Status: AC
  Administered 2018-07-20: 15 mg via INTRAVENOUS

## 2018-07-20 MED ORDER — HYDROMORPHONE HCL 1 MG/ML IJ SOLN
1.0000 mg | Freq: Once | INTRAMUSCULAR | Status: AC
  Administered 2018-07-20: 1 mg via INTRAVENOUS
  Filled 2018-07-20: qty 1

## 2018-07-20 MED ORDER — METHOCARBAMOL 1000 MG/10ML IJ SOLN
1000.0000 mg | Freq: Once | INTRAVENOUS | Status: AC
  Administered 2018-07-20: 1000 mg via INTRAVENOUS
  Filled 2018-07-20: qty 10

## 2018-07-20 NOTE — ED Provider Notes (Signed)
[facility] EMERGENCY DEPARTMENT Provider Note   CSN: [id number] Arrival date & time: 07/20/18  1828     History   Chief Complaint Chief Complaint  Patient presents with  . Flank Pain    HPI Austin Church is a 22 y.o. male with past medical history of asthma, status post ureteral stent placement injuries of right kidney done 2 years ago, who presents to ED for gradual onset of left-sided flank, abdominal pain.  States that symptoms initially began about 20 hours ago.  He reported complaining to his mom of some vague pain in the area.  Since waking up this morning, approximately 12 hours ago his pain got worse.  He stood up after he bent forward to lift something off of the floor and had sudden worsening of his pain.  He had one episodes of nonbloody, nonbilious emesis secondary to the pain.  States that the pain is sharp, rated at 10/10.  States that his back feels "numb."  Worse with palpation and movement.  He has no pain when he sits still in one position.  No improvement with Percocet that belonged to his friend.  No history of similar symptoms in the past.  He reports dysuria secondary to UTI which she was seen for 2 days ago. Denies any chest pain, changes to bowel movements, injuries or falls.  HPI  Past Medical History:  Diagnosis Date  . Asthma    no routine meds used  . Nausea and vomiting    intermittent with abdominal pain  . Seasonal allergies   . Stricture, ureter 0ct 2016    Patient Active Problem List   Diagnosis Date Noted  . 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 CYSTOSCOPY W/ URETERAL STENT PLACEMENT Right 07/30/2015   Procedure: CYSTOSCOPY WITH RETROGRADE PYELOGRAM/URETERAL STENT PLACEMENT;  Surgeon: Sebastian Ache, MD;  Location: [facility];  Service: Urology;  Laterality: Right;  . FINGER SURGERY    . ROBOT ASSISTED PYELOPLASTY Right 07/30/2015   Procedure: ROBOTIC ASSISTED  PYELOPLASTY WITH STENT PLACEMENT;  Surgeon: Sebastian Ache, MD;  Location: [facility];  Service: Urology;  Laterality: Right;        Home Medications    Prior to Admission medications   Medication Sig Start Date End Date Taking? Authorizing Provider  albuterol (PROVENTIL HFA;VENTOLIN HFA) 108 (90 Base) MCG/ACT inhaler Inhale 1-2 puffs into the lungs every 6 (six) hours as needed for wheezing or shortness of breath. 07/24/16   Linwood Dibbles, MD  amoxicillin-clavulanate (AUGMENTIN) 875-125 MG tablet Take 1 tablet by mouth 2 (two) times daily. 12/12/15   Mesner, Barbara Cower, MD  cephALEXin (KEFLEX) 500 MG capsule Take 1 capsule (500 mg total) by mouth 2 (two) times daily for 5 days. 07/17/18 07/22/18  Mortis, Jerrel Ivory I, PA-C  dexamethasone (DECADRON) 6 MG tablet Take 2 tablets (12 mg total) by mouth once as needed (sore throat). If sore throat returns/still not improved 12/15/15   Mesner, Barbara Cower, MD  dextromethorphan-guaiFENesin [facility] DM) 30-600 MG 12hr tablet Take 1 tablet by mouth 2 (two) times daily as needed for cough.    [provider]  ibuprofen (ADVIL,MOTRIN) 800 MG tablet Take 1 tablet (800 mg total) by mouth every 8 (eight) hours as needed. 02/25/17   Lawyer, Cristal Deer, PA-C  ondansetron (ZOFRAN ODT) 4 MG disintegrating tablet Take 1 tablet (4 mg total) by mouth every 8 (eight) hours as needed for nausea or vomiting. 01/20/17   Dorena Bodo,  NP  ondansetron (ZOFRAN) 4 MG tablet Take 1 tablet (4 mg total) by mouth every 8 (eight) hours as needed for nausea or vomiting. 12/12/15   Mesner, Barbara Cower, MD  oseltamivir (TAMIFLU) 75 MG capsule Take 1 capsule (75 mg total) by mouth every 12 (twelve) hours. 01/20/17   Dorena Bodo, NP  phenol (CHLORASEPTIC) 1.4 % LIQD Use as directed 1 spray in the mouth or throat every 6 (six) hours as needed for throat irritation / pain.    [provider]  traMADol (ULTRAM) 50 MG tablet Take 1 tablet (50 mg total) by mouth every 6 (six) hours as  needed for severe pain. 02/25/17   Charlestine Night, PA-C    Family History Family History  Problem Relation Age of Onset  . Cancer Other     Social History Social History   Tobacco Use  . Smoking status: Current Some Day Smoker    Packs/day: 0.25    Years: 10.00    Pack years: 2.50    Types: Cigarettes  . Smokeless tobacco: Never Used  Substance Use Topics  . Alcohol use: No  . Drug use: No    Types: Marijuana    Comment: advised     Allergies   Pineapple   Review of Systems Review of Systems  Constitutional: Negative for appetite change, chills and fever.  HENT: Negative for ear pain, rhinorrhea, sneezing and sore throat.   Eyes: Negative for photophobia and visual disturbance.  Respiratory: Negative for cough, chest tightness, shortness of breath and wheezing.   Cardiovascular: Negative for chest pain and palpitations.  Gastrointestinal: Positive for abdominal pain and vomiting. Negative for blood in stool, constipation, diarrhea and nausea.  Genitourinary: Positive for dysuria and flank pain. Negative for hematuria and urgency.  Musculoskeletal: Negative for myalgias.  Skin: Negative for rash.  Neurological: Negative for dizziness, weakness and light-headedness.     Physical Exam Updated Vital Signs BP (!) 145/91 (BP Location: Right Arm)   Pulse 94   Temp 98.6 F (37 C) (Oral)   Resp 18   Ht 5\' 11"  (1.803 m)   Wt 81.6 kg   SpO2 100%   BMI 25.10 kg/m   Physical Exam  Constitutional: He appears well-developed and well-nourished. No distress.  HENT:  Head: Normocephalic and atraumatic.  Nose: Nose normal.  Eyes: Conjunctivae and EOM are normal. Right eye exhibits no discharge. Left eye exhibits no discharge. No scleral icterus.  Neck: Normal range of motion. Neck supple.  Cardiovascular: Regular rhythm, normal heart sounds and intact distal pulses. Tachycardia present. Exam reveals no gallop and no friction rub.  No murmur heard. Pulmonary/Chest:  Effort normal and breath sounds normal. No respiratory distress.  Abdominal: Soft. Bowel sounds are normal. He exhibits no distension. There is tenderness (Left flank, left middle abdomen). There is no guarding.  Musculoskeletal: Normal range of motion. He exhibits no edema.  Neurological: He is alert. He exhibits normal muscle tone. Coordination normal.  Skin: Skin is warm and dry. No rash noted.  Psychiatric: He has a normal mood and affect.  Nursing note and vitals reviewed.    ED Treatments / Results  Labs (all labs ordered are listed, but only abnormal results are displayed) Labs Reviewed  CBC WITH DIFFERENTIAL/PLATELET - Abnormal; Notable for the following components:      Result Value   WBC 13.1 (*)    RBC 6.64 (*)    MCV 75.6 (*)    MCH 22.1 (*)    MCHC 29.3 (*)  RDW 15.9 (*)    Neutro Abs 8.5 (*)    Abs Immature Granulocytes 0.08 (*)    All other components within normal limits  COMPREHENSIVE METABOLIC PANEL - Abnormal; Notable for the following components:   BUN 23 (*)    Creatinine, Ser 1.94 (*)    AST 156 (*)    GFR calc non Af Amer 47 (*)    GFR calc Af Amer 55 (*)    All other components within normal limits  URINALYSIS, ROUTINE W REFLEX MICROSCOPIC - Abnormal; Notable for the following components:   Hgb urine dipstick LARGE (*)    Protein, ur 30 (*)    All other components within normal limits  I-STAT CG4 LACTIC ACID, ED - Abnormal; Notable for the following components:   Lactic Acid, Venous 0.43 (*)    All other components within normal limits  URINE CULTURE  LIPASE, BLOOD  CK  I-STAT CG4 LACTIC ACID, ED    EKG None  Radiology Ct Renal Stone Study  Result Date: 07/20/2018 CLINICAL DATA:  Left flank pain. EXAM: CT ABDOMEN AND PELVIS WITHOUT CONTRAST TECHNIQUE: Multidetector CT imaging of the abdomen and pelvis was performed following the standard protocol without IV contrast. COMPARISON:  Body CT 06/21/2015 FINDINGS: Lower chest: No acute  abnormality. Hepatobiliary: No focal liver abnormality is seen. No gallstones, gallbladder wall thickening, or biliary dilatation. Pancreas: Unremarkable. No pancreatic ductal dilatation or surrounding inflammatory changes. Spleen: Normal in size without focal abnormality. Adrenals/Urinary Tract: Adrenal glands are unremarkable. Kidneys are normal, without renal calculi, focal lesion, or hydronephrosis. Bladder is unremarkable. Stomach/Bowel: Stomach is within normal limits. Appendix appears normal. No evidence of bowel wall thickening, distention, or inflammatory changes. Vascular/Lymphatic: No significant vascular findings are present. No enlarged abdominal or pelvic lymph nodes. Reproductive: Prostate is unremarkable. Other: No abdominal wall hernia or abnormality. No abdominopelvic ascites. Musculoskeletal: No acute or significant osseous findings. IMPRESSION: No evidence of obstructive uropathy, nephrolithiasis or other acute findings in the abdomen. Electronically Signed   By: Ted Mcalpine M.D.   On: 07/20/2018 20:05    Procedures Procedures (including critical care time)  Medications Ordered in ED Medications  HYDROmorphone (DILAUDID) injection 1 mg (has no administration in time range)  ketorolac (TORADOL) 15 MG/ML injection 15 mg (15 mg Intravenous Given 07/20/18 1904)  HYDROmorphone (DILAUDID) injection 1 mg (1 mg Intravenous Given 07/20/18 2017)  sodium chloride 0.9 % bolus 1,000 mL (0 mLs Intravenous Stopped 07/20/18 2156)  methocarbamol (ROBAXIN) 1,000 mg in dextrose 5 % 50 mL IVPB (0 mg Intravenous Stopped 07/20/18 2318)  cefTRIAXone (ROCEPHIN) 1 g in sodium chloride 0.9 % 100 mL IVPB (0 g Intravenous Stopped 07/20/18 2342)     Initial Impression / Assessment and Plan / ED Course  I have reviewed the triage vital signs and the nursing notes.  Pertinent labs & imaging results that were available during my care of the patient were reviewed by me and considered in my medical  decision making (see chart for details).     22 year old male presents to ED for left-sided flank pain and abdominal pain.  Symptoms began at this point, about 24 hours ago.  Symptoms worsen after he abruptly stood up after bending forward to lift something off the floor.  One episode of nonbloody, nonbilious emesis secondary to the pain.  He states that pain is sharp, rated at 10/10, worse with movement and palpation.  No improvement with Percocet that belonged to his friend.  He reports dysuria secondary to UTI  which she was seen for 2 days ago.  On exam there is significant tenderness palpation of the left flank and abdomen.  No rebound or guarding noted.  Patient is ambulatory.  Patient initially tachycardic to 130s.  This is most likely secondary to his pain.  Lab work significant for urine showing hemoglobin, leukocytosis of 13, AKI with creatinine of 1.9, BUN of 23 and GFR of 55.  CT renal stone study shows no acute abnormalities.  Patient was given Dilaudid, Robaxin, Toradol.  However, he states that he is continuing to have pain in the area.  Heart rate has improved and patient does appear more comfortable than he did prior.  He was given 1 dose of Rocephin for possible pyelonephritis based on his urinalysis that was done 2 days ago.  However, patient eloped before the remainder of work-up could be completed.  Portions of this note were generated with Scientist, clinical (histocompatibility and immunogenetics). Dictation errors may occur despite best attempts at proofreading.   Final Clinical Impressions(s) / ED Diagnoses   Final diagnoses:  None    ED Discharge Orders    None       Dietrich Pates, PA-C 07/20/18 2345    Tegeler, Canary Brim, MD 07/21/18 [phone]

## 2018-07-20 NOTE — ED Triage Notes (Signed)
Patient to ED c/o severe L flank pain onset this morning - states it has worsened throughout the day with N/V. Patient unable to sit still in triage, reports pain travels down his side. Denies fevers/chills, no other urinary symptoms.

## 2018-07-20 NOTE — ED Notes (Signed)
Pt. Found yelling at family at bedside saying he was leaving. Pt. Stated "I feel the f*cking same". Pt. Left down the hall before a provider was able to speak with him.

## 2018-07-20 NOTE — ED Provider Notes (Signed)
Patient placed in Quick Look pathway, seen and evaluated   Chief Complaint: L flank pain  HPI:   22 year old male presents with severe L flank pain starting today. He was seen in the ED several days ago for penile discharge and was treated for [facility] and a UTI. He has been taking his antibiotics. He reports an episode of vomiting last night. No fever. The pain is severe and radiates down to his thigh. His back feels numb. No fever, chills, urinary symptoms, testicular pain. He took a Building control surveyor for pain which didn't help  ROS: +flank pain  Physical Exam:   Gen: Distressed, crying  Neuro: Awake and Alert  Skin: Warm    Focused Exam: Heart: Tachycardic and regular    Lungs: CTA    Abdomen: Significant tenderness with light palpation over the L abdomen and L flank   Initiation of care has begun. The patient has been counseled on the process, plan, and necessity for staying for the completion/evaluation, and the remainder of the medical screening examination    Bethel Born, PA-C 07/20/18 1912    Jacalyn Lefevre, MD 07/20/18 [phone]

## 2018-07-21 LAB — CK: Total CK: 1860 U/L — ABNORMAL HIGH (ref 49–397)

## 2018-07-22 LAB — URINE CULTURE: Culture: NO GROWTH

## 2018-08-18 ENCOUNTER — Emergency Department (HOSPITAL_COMMUNITY)
Admission: EM | Admit: 2018-08-18 | Discharge: 2018-09-03 | Disposition: E | Payer: Self-pay | Attending: Emergency Medicine | Admitting: Emergency Medicine

## 2018-08-18 ENCOUNTER — Encounter (HOSPITAL_COMMUNITY): Payer: Self-pay

## 2018-08-18 ENCOUNTER — Emergency Department (HOSPITAL_COMMUNITY): Payer: Self-pay

## 2018-08-18 DIAGNOSIS — Y92410 Unspecified street and highway as the place of occurrence of the external cause: Secondary | ICD-10-CM | POA: Insufficient documentation

## 2018-08-18 DIAGNOSIS — T1490XA Injury, unspecified, initial encounter: Secondary | ICD-10-CM

## 2018-08-18 DIAGNOSIS — Y999 Unspecified external cause status: Secondary | ICD-10-CM | POA: Insufficient documentation

## 2018-08-18 DIAGNOSIS — I468 Cardiac arrest due to other underlying condition: Secondary | ICD-10-CM | POA: Insufficient documentation

## 2018-08-18 DIAGNOSIS — Y9389 Activity, other specified: Secondary | ICD-10-CM | POA: Insufficient documentation

## 2018-08-18 DIAGNOSIS — S0101XA Laceration without foreign body of scalp, initial encounter: Secondary | ICD-10-CM | POA: Insufficient documentation

## 2018-08-18 LAB — I-STAT CG4 LACTIC ACID, ED: Lactic Acid, Venous: 9.71 mmol/L (ref 0.5–1.9)

## 2018-08-18 LAB — I-STAT ARTERIAL BLOOD GAS, ED
Acid-base deficit: 18 mmol/L — ABNORMAL HIGH (ref 0.0–2.0)
Bicarbonate: 14.4 mmol/L — ABNORMAL LOW (ref 20.0–28.0)
O2 SAT: 75 %
PCO2 ART: 65.8 mmHg — AB (ref 32.0–48.0)
Patient temperature: 95
TCO2: 17 mmol/L — AB (ref 22–32)
pH, Arterial: 6.934 — CL (ref 7.350–7.450)
pO2, Arterial: 59 mmHg — ABNORMAL LOW (ref 83.0–108.0)

## 2018-08-18 LAB — ABO/RH: ABO/RH(D): O POS

## 2018-08-18 MED ORDER — EPINEPHRINE PF 1 MG/10ML IJ SOSY
PREFILLED_SYRINGE | INTRAMUSCULAR | Status: AC | PRN
  Administered 2018-08-18: 1 mg via INTRAVENOUS

## 2018-08-18 MED ORDER — EPINEPHRINE PF 1 MG/10ML IJ SOSY
PREFILLED_SYRINGE | INTRAMUSCULAR | Status: AC | PRN
  Administered 2018-08-18 (×2): 1 mg via INTRAVENOUS

## 2018-08-18 MED ORDER — SODIUM CHLORIDE 0.9 % IV SOLN
INTRAVENOUS | Status: AC | PRN
  Administered 2018-08-18: 1000 mL via INTRAVENOUS

## 2018-08-18 MED ORDER — NOREPINEPHRINE 4 MG/250ML-% IV SOLN
INTRAVENOUS | Status: AC | PRN
  Administered 2018-08-18: 10 ug/kg/min via INTRAVENOUS

## 2018-08-18 NOTE — ED Notes (Signed)
Chest compressions continues using [facility] .

## 2018-08-18 NOTE — ED Notes (Signed)
PRBC#7 completed , CPR in progress , manual chest compressions in progress.

## 2018-08-18 NOTE — ED Notes (Signed)
EDP intubating pt. at this time , 1st unit PRBC started using [facility] rapid infuser .

## 2018-08-18 NOTE — ED Notes (Signed)
3 rd unit PRBC infusing.

## 2018-08-18 NOTE — Procedures (Signed)
Description: The right chest was prepped and draped. An incision was made and blunt dissection used to enter the pleural cavity. 32Fr chest tube insertion with no return of fluid. Tube secured with 2-0 silk and connected to pleuravac.  Blood loss: minimal  Anesthesia: none  Complications: none

## 2018-08-18 NOTE — ED Notes (Addendum)
Chest compressions restarted . #7 FFP/#1 platelet  completed.

## 2018-08-18 NOTE — ED Notes (Signed)
FFP #6 completed .

## 2018-08-18 NOTE — ED Notes (Signed)
#  9 and #10 PRBC completed , Levophed drip infusing , EDP inserting cenral line , # 5 FFP completed.

## 2018-08-18 NOTE — ED Notes (Signed)
Dr Clarene DukeLittle informed of lactic acid results 9.71

## 2018-08-18 NOTE — ED Notes (Signed)
1st FFP infusing .

## 2018-08-18 NOTE — ED Notes (Signed)
1st unit platelet infusing .

## 2018-08-18 NOTE — ED Notes (Signed)
[facility]

## 2018-08-18 NOTE — ED Notes (Signed)
3rd PRBC completed  , 4th PRBC infusing per [facility] rapid infuser.

## 2018-08-18 NOTE — ED Notes (Signed)
PRBC#8 completed , chest compressions continues.

## 2018-08-18 NOTE — ED Notes (Signed)
FFP#4 completed.

## 2018-08-18 NOTE — ED Notes (Signed)
2nd FFP completed

## 2018-08-18 NOTE — ED Notes (Signed)
#   12 PRBC completed .

## 2018-08-18 NOTE — ED Notes (Signed)
Patient pronounced dead by Dr. Elvera Lennox. Austin Church at 2245.

## 2018-08-18 NOTE — ED Notes (Addendum)
#  8 FFP completed , Levophed drip infusing , Epinephrine drip started at 4 mcg/min . Chest compressions continues . #11 PRBC infusing.

## 2018-08-18 NOTE — ED Notes (Signed)
#  13 PRBC completed.

## 2018-08-18 NOTE — ED Notes (Signed)
3rd FFP infusing
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
## 2018-08-18 NOTE — ED Notes (Signed)
PRBC #5 and #6 completed.

## 2018-08-19 LAB — PREPARE PLATELET PHERESIS
UNIT DIVISION: 0
Unit division: 0

## 2018-08-19 LAB — BPAM PLATELET PHERESIS
BLOOD PRODUCT EXPIRATION DATE: 201911172359
Blood Product Expiration Date: 201911162359
ISSUE DATE / TIME: 201911152209
ISSUE DATE / TIME: 201911152249
Unit Type and Rh: 7300
Unit Type and Rh: 7300

## 2018-08-19 LAB — PREPARE CRYOPRECIPITATE: Unit division: 0

## 2018-08-19 LAB — BPAM CRYOPRECIPITATE
Blood Product Expiration Date: 201911160422
ISSUE DATE / TIME: 201911152247
Unit Type and Rh: 6200

## 2018-08-19 MED FILL — Medication: Qty: 1 | Status: AC

## 2018-08-20 LAB — TYPE AND SCREEN
ABO/RH(D): O POS
Antibody Screen: NEGATIVE
UNIT DIVISION: 0
UNIT DIVISION: 0
UNIT DIVISION: 0
UNIT DIVISION: 0
UNIT DIVISION: 0
UNIT DIVISION: 0
UNIT DIVISION: 0
UNIT DIVISION: 0
UNIT DIVISION: 0
UNIT DIVISION: 0
UNIT DIVISION: 0
Unit division: 0
Unit division: 0
Unit division: 0
Unit division: 0
Unit division: 0
Unit division: 0
Unit division: 0
Unit division: 0
Unit division: 0
Unit division: 0
Unit division: 0
Unit division: 0
Unit division: 0
Unit division: 0
Unit division: 0
Unit division: 0
Unit division: 0
Unit division: 0
Unit division: 0

## 2018-08-20 LAB — BPAM FFP
BLOOD PRODUCT EXPIRATION DATE: 201911202359
BLOOD PRODUCT EXPIRATION DATE: 201911202359
BLOOD PRODUCT EXPIRATION DATE: 201911202359
BLOOD PRODUCT EXPIRATION DATE: 201911202359
BLOOD PRODUCT EXPIRATION DATE: 201911202359
BLOOD PRODUCT EXPIRATION DATE: 201912072359
BLOOD PRODUCT EXPIRATION DATE: 201912072359
Blood Product Expiration Date: 201911192359
Blood Product Expiration Date: 201911192359
Blood Product Expiration Date: 201911202359
Blood Product Expiration Date: 201911202359
Blood Product Expiration Date: 201912022359
Blood Product Expiration Date: 201912032359
Blood Product Expiration Date: 201912062359
Blood Product Expiration Date: 201912062359
Blood Product Expiration Date: 201912062359
ISSUE DATE / TIME: 201911152139
ISSUE DATE / TIME: 201911152139
ISSUE DATE / TIME: 201911152201
ISSUE DATE / TIME: 201911152201
ISSUE DATE / TIME: 201911152213
ISSUE DATE / TIME: 201911152213
ISSUE DATE / TIME: 201911152233
ISSUE DATE / TIME: 201911152243
ISSUE DATE / TIME: 201911152243
ISSUE DATE / TIME: 201911152243
ISSUE DATE / TIME: 201911152243
ISSUE DATE / TIME: 201911152213
ISSUE DATE / TIME: 201911152213
ISSUE DATE / TIME: 201911152243
ISSUE DATE / TIME: 201911161420
ISSUE DATE / TIME: 201911161524
UNIT TYPE AND RH: 600
UNIT TYPE AND RH: 6200
UNIT TYPE AND RH: 6200
UNIT TYPE AND RH: 6200
UNIT TYPE AND RH: 6200
UNIT TYPE AND RH: 6200
UNIT TYPE AND RH: 6200
Unit Type and Rh: 6200
Unit Type and Rh: 6200
Unit Type and Rh: 6200
Unit Type and Rh: 6200
Unit Type and Rh: 6200
Unit Type and Rh: 6200
Unit Type and Rh: 6200
Unit Type and Rh: 6200
Unit Type and Rh: 6200

## 2018-08-20 LAB — PREPARE FRESH FROZEN PLASMA
UNIT DIVISION: 0
UNIT DIVISION: 0
UNIT DIVISION: 0
UNIT DIVISION: 0
Unit division: 0
Unit division: 0
Unit division: 0
Unit division: 0
Unit division: 0
Unit division: 0
Unit division: 0
Unit division: 0
Unit division: 0
Unit division: 0

## 2018-08-20 LAB — BPAM RBC
BLOOD PRODUCT EXPIRATION DATE: 201912132359
BLOOD PRODUCT EXPIRATION DATE: 201912132359
BLOOD PRODUCT EXPIRATION DATE: 201912142359
BLOOD PRODUCT EXPIRATION DATE: 201912152359
BLOOD PRODUCT EXPIRATION DATE: 201912152359
BLOOD PRODUCT EXPIRATION DATE: 201912152359
BLOOD PRODUCT EXPIRATION DATE: 201912152359
BLOOD PRODUCT EXPIRATION DATE: 201912152359
BLOOD PRODUCT EXPIRATION DATE: 201912152359
BLOOD PRODUCT EXPIRATION DATE: 201912162359
BLOOD PRODUCT EXPIRATION DATE: 201912162359
BLOOD PRODUCT EXPIRATION DATE: 201912162359
BLOOD PRODUCT EXPIRATION DATE: 201912162359
Blood Product Expiration Date: 201912132359
Blood Product Expiration Date: 201912132359
Blood Product Expiration Date: 201912132359
Blood Product Expiration Date: 201912142359
Blood Product Expiration Date: 201912152359
Blood Product Expiration Date: 201912152359
Blood Product Expiration Date: 201912152359
Blood Product Expiration Date: 201912152359
Blood Product Expiration Date: 201912152359
Blood Product Expiration Date: 201912152359
Blood Product Expiration Date: 201912152359
Blood Product Expiration Date: 201912152359
Blood Product Expiration Date: 201912152359
Blood Product Expiration Date: 201912152359
Blood Product Expiration Date: 201912152359
Blood Product Expiration Date: 201912162359
Blood Product Expiration Date: 201912162359
ISSUE DATE / TIME: 201911152139
ISSUE DATE / TIME: 201911152201
ISSUE DATE / TIME: 201911152208
ISSUE DATE / TIME: 201911152216
ISSUE DATE / TIME: 201911152216
ISSUE DATE / TIME: 201911152216
ISSUE DATE / TIME: 201911152216
ISSUE DATE / TIME: 201911152225
ISSUE DATE / TIME: 201911152225
ISSUE DATE / TIME: 201911152227
ISSUE DATE / TIME: 201911152232
ISSUE DATE / TIME: 201911152232
ISSUE DATE / TIME: 201911152237
ISSUE DATE / TIME: 201911152237
ISSUE DATE / TIME: 201911152237
ISSUE DATE / TIME: 201911152256
ISSUE DATE / TIME: 201911152256
ISSUE DATE / TIME: 201911171037
ISSUE DATE / TIME: 201911171117
ISSUE DATE / TIME: 201911152139
ISSUE DATE / TIME: 201911152201
ISSUE DATE / TIME: 201911152208
ISSUE DATE / TIME: 201911152225
ISSUE DATE / TIME: 201911152225
ISSUE DATE / TIME: 201911152232
ISSUE DATE / TIME: 201911152232
ISSUE DATE / TIME: 201911152237
ISSUE DATE / TIME: 201911152256
ISSUE DATE / TIME: 201911152256
ISSUE DATE / TIME: 201911161813
UNIT TYPE AND RH: 5100
UNIT TYPE AND RH: 5100
UNIT TYPE AND RH: 5100
UNIT TYPE AND RH: 5100
UNIT TYPE AND RH: 5100
UNIT TYPE AND RH: 5100
UNIT TYPE AND RH: 5100
UNIT TYPE AND RH: 5100
UNIT TYPE AND RH: 5100
UNIT TYPE AND RH: 5100
UNIT TYPE AND RH: 5100
UNIT TYPE AND RH: 5100
UNIT TYPE AND RH: 5100
UNIT TYPE AND RH: 5100
UNIT TYPE AND RH: 5100
UNIT TYPE AND RH: 5100
UNIT TYPE AND RH: 5100
Unit Type and Rh: 5100
Unit Type and Rh: 5100
Unit Type and Rh: 5100
Unit Type and Rh: 5100
Unit Type and Rh: 5100
Unit Type and Rh: 5100
Unit Type and Rh: 5100
Unit Type and Rh: 5100
Unit Type and Rh: 5100
Unit Type and Rh: 5100
Unit Type and Rh: 5100
Unit Type and Rh: 5100
Unit Type and Rh: 5100

## 2018-08-21 ENCOUNTER — Encounter (HOSPITAL_COMMUNITY): Payer: Self-pay | Admitting: Emergency Medicine

## 2018-09-03 NOTE — ED Notes (Signed)
Family at bedside. 
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
## 2018-09-03 DEATH — deceased

## 2020-09-19 IMAGING — CT CT RENAL STONE PROTOCOL
2 of 4 series · 16 of 46 positions shown, 18 images · non-contrast
Comparison: Body CT 06/21/2015

CLINICAL DATA: Left flank pain.

EXAM:
CT ABDOMEN AND PELVIS WITHOUT CONTRAST
TECHNIQUE: Multidetector CT imaging of the abdomen and pelvis was performed
following the standard protocol without IV contrast.

[Series 3: ap without · axial · non-contrast · 0.73mm/px · z∈[-389,+11]mm · 13 of 92 slices shown, 15 images]
[im 6/92  soft-tissue]
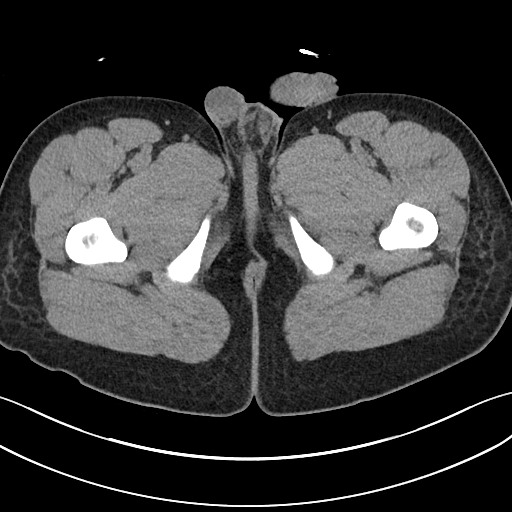
[im 6/92  bone]
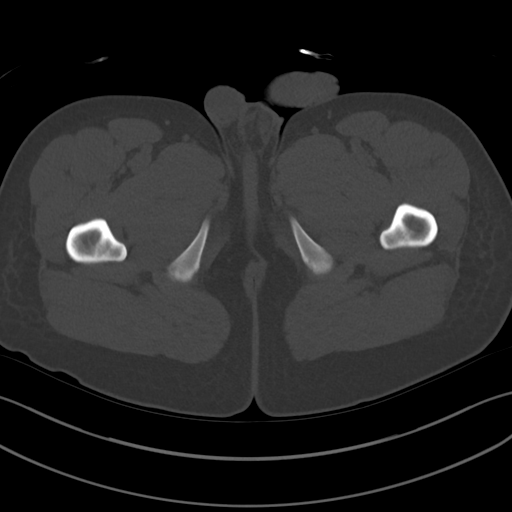
[im 12/92  soft-tissue]
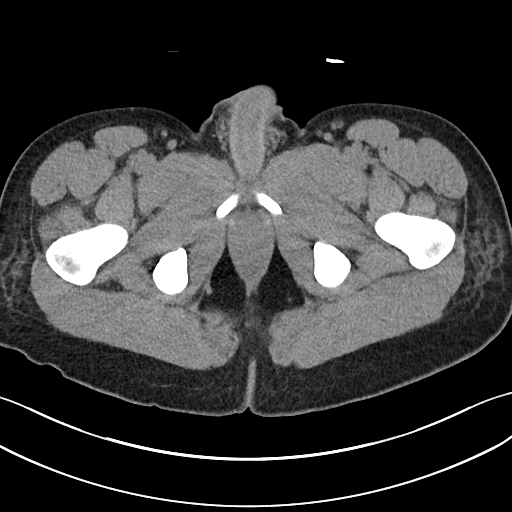
[im 18/92  soft-tissue]
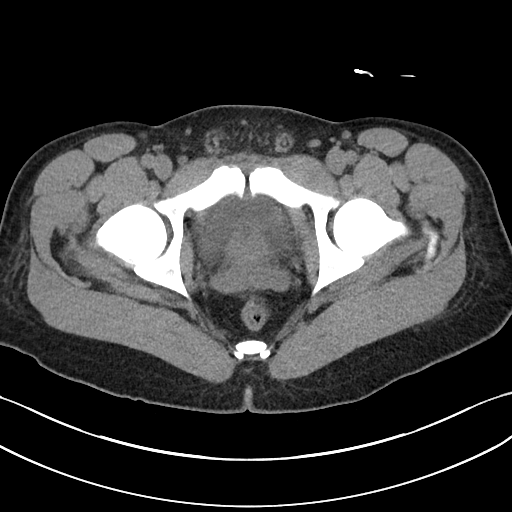
[im 29/92  soft-tissue]
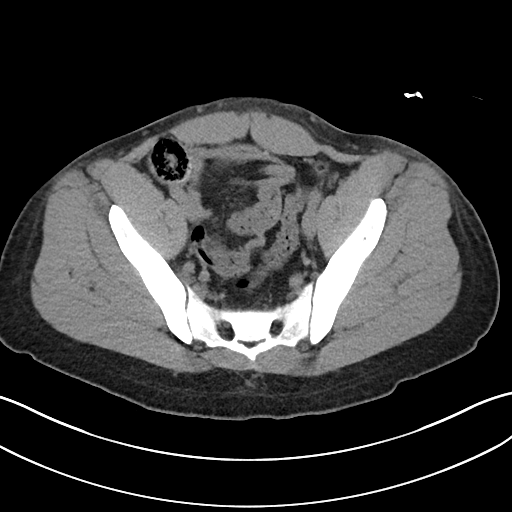
[im 35/92  soft-tissue]
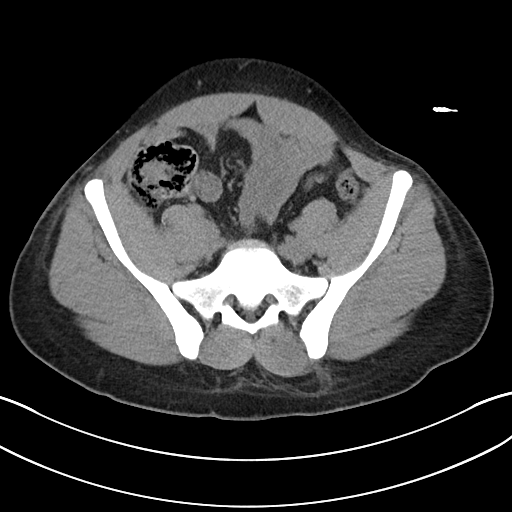
[im 40/92  soft-tissue]
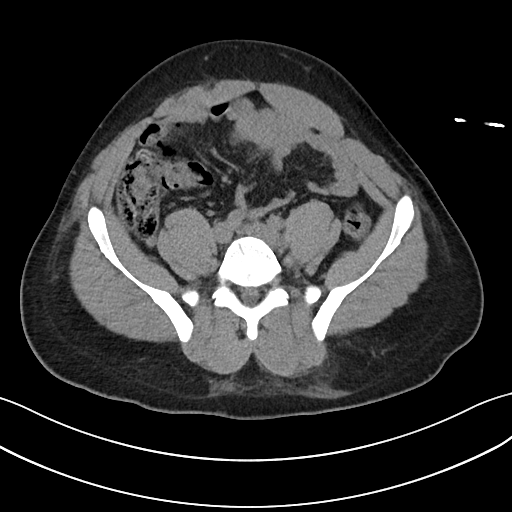
[im 46/92  soft-tissue]
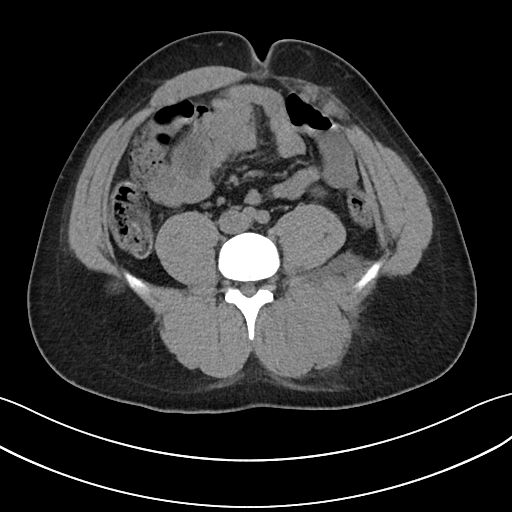
[im 52/92  soft-tissue]
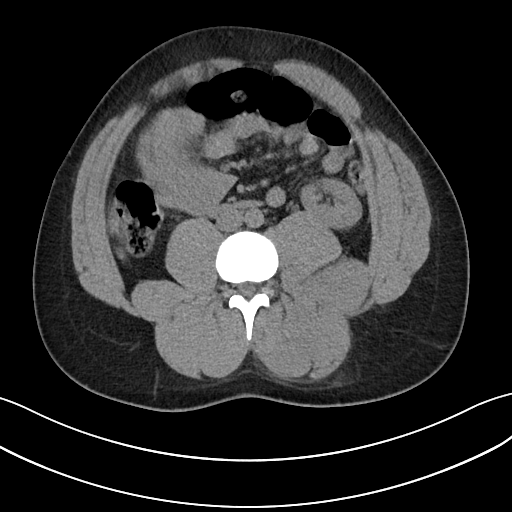
[im 57/92  soft-tissue]
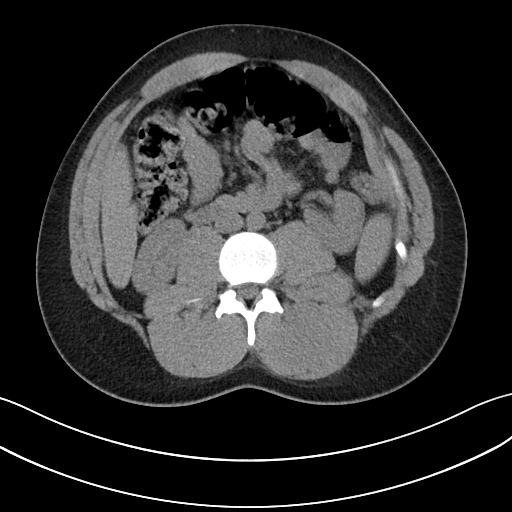
[im 57/92  bone]
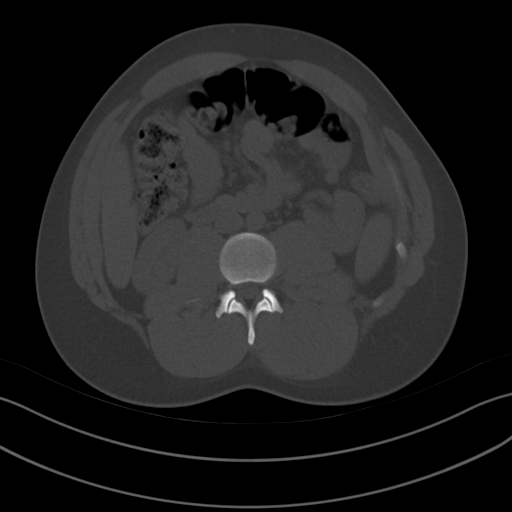
[im 63/92  soft-tissue]
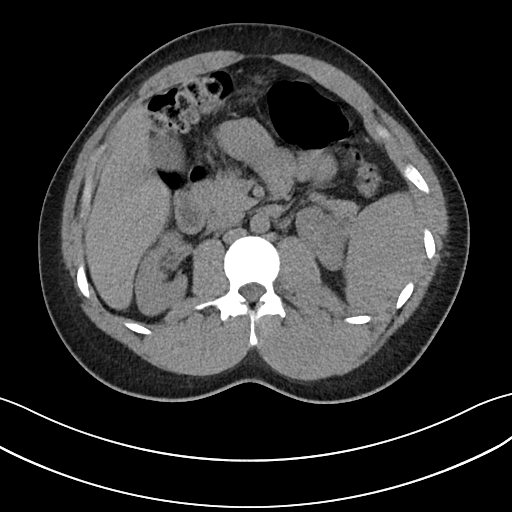
[im 74/92  soft-tissue]
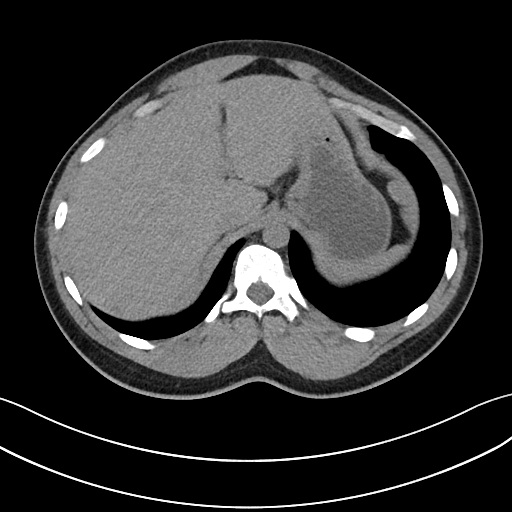
[im 80/92  soft-tissue]
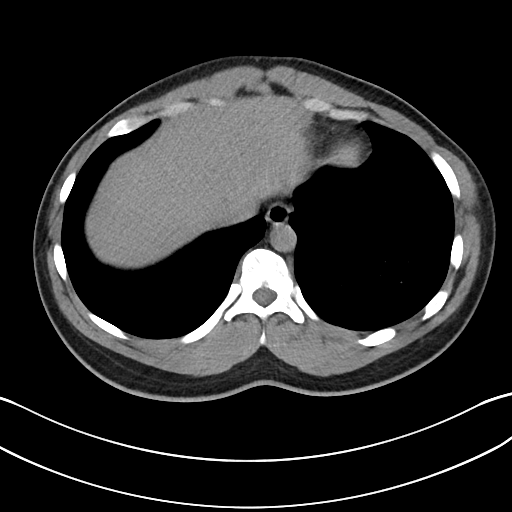
[im 86/92  soft-tissue]
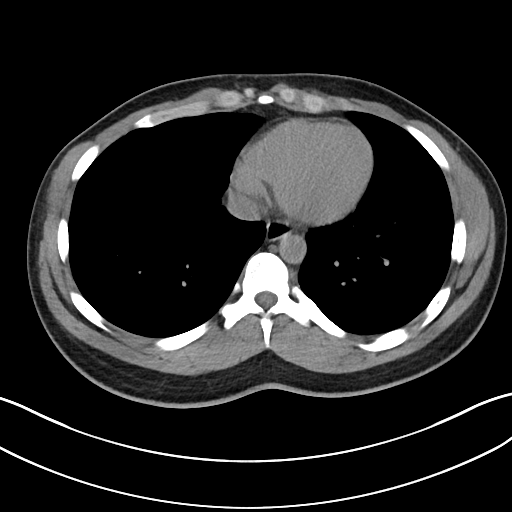

[Series 6: cor · coronal · 0.68mm/px · 3 of 85 slices shown]
[im 29/85  soft-tissue]
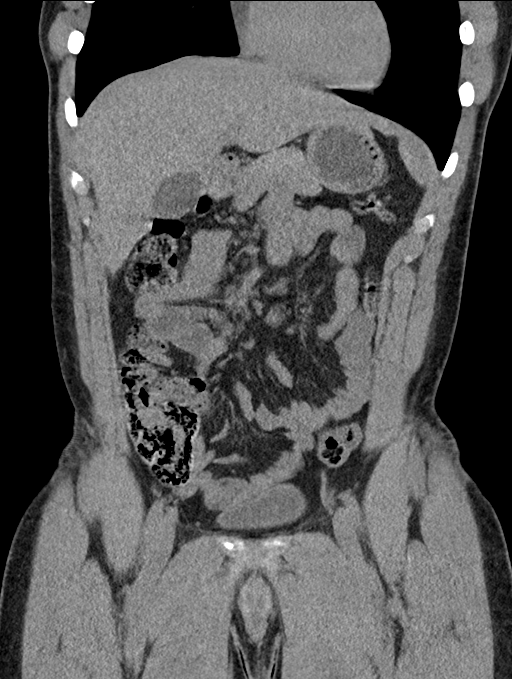
[im 38/85  soft-tissue]
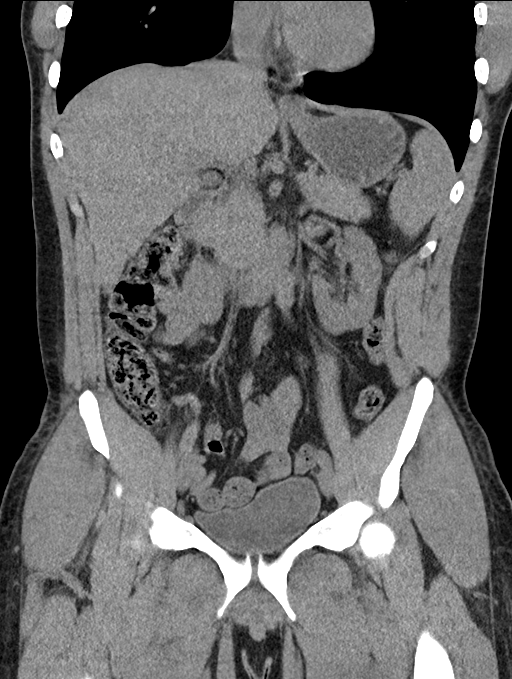
[im 47/85  soft-tissue]
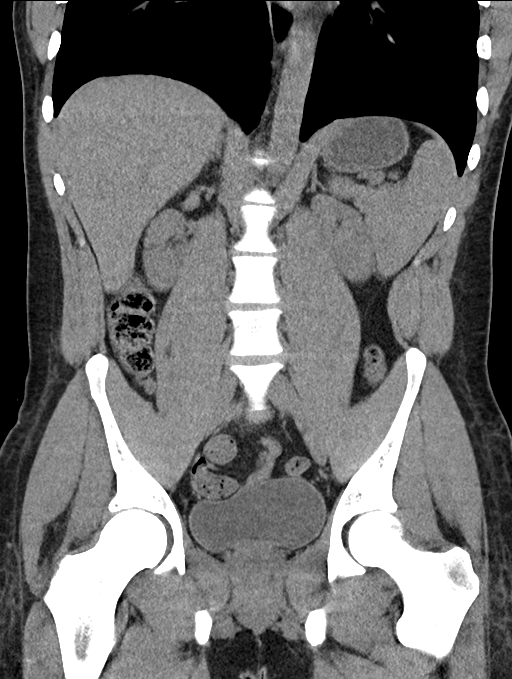

[16 of 46 positions shown; findings below may reference images not displayed]

FINDINGS: Lower chest: No acute abnormality.

Hepatobiliary: No focal liver abnormality is seen. No gallstones,
gallbladder wall thickening, or biliary dilatation.

Pancreas: Unremarkable. No pancreatic ductal dilatation or
surrounding inflammatory changes.

Spleen: Normal in size without focal abnormality.

Adrenals/Urinary Tract: Adrenal glands are unremarkable. Kidneys are
normal, without renal calculi, focal lesion, or hydronephrosis.
Bladder is unremarkable.

Stomach/Bowel: Stomach is within normal limits. Appendix appears
normal. No evidence of bowel wall thickening, distention, or
inflammatory changes.

Vascular/Lymphatic: No significant vascular findings are present. No
enlarged abdominal or pelvic lymph nodes.

Reproductive: Prostate is unremarkable.

Other: No abdominal wall hernia or abnormality. No abdominopelvic
ascites.

Musculoskeletal: No acute or significant osseous findings.
IMPRESSION: No evidence of obstructive uropathy, nephrolithiasis or other acute
findings in the abdomen.
